# Patient Record
Sex: Female | Born: 1950 | Race: White | Hispanic: No | Smoking: Never smoker
Health system: Southern US, Community
[De-identification: ages and names within clinical notes are randomized; demographics above are authoritative.]

## PROBLEM LIST (undated history)

## (undated) DIAGNOSIS — I10 Essential (primary) hypertension: Secondary | ICD-10-CM

## (undated) HISTORY — PX: CHOLECYSTECTOMY: SHX55

## (undated) HISTORY — PX: TONSILLECTOMY: SUR1361

---

## 2001-11-17 ENCOUNTER — Observation Stay (HOSPITAL_COMMUNITY): Admission: EM | Admit: 2001-11-17 | Discharge: 2001-11-18 | Payer: Self-pay | Admitting: Emergency Medicine

## 2001-11-18 ENCOUNTER — Encounter: Payer: Self-pay | Admitting: Emergency Medicine

## 2011-05-17 ENCOUNTER — Emergency Department (HOSPITAL_BASED_OUTPATIENT_CLINIC_OR_DEPARTMENT_OTHER)
Admission: EM | Admit: 2011-05-17 | Discharge: 2011-05-17 | Disposition: A | Discharge status: Home / Self Care | Payer: PRIVATE HEALTH INSURANCE | Attending: Emergency Medicine | Admitting: Emergency Medicine

## 2011-05-17 ENCOUNTER — Encounter: Payer: Self-pay | Admitting: *Deleted

## 2011-05-17 DIAGNOSIS — R05 Cough: Secondary | ICD-10-CM | POA: Insufficient documentation

## 2011-05-17 DIAGNOSIS — E669 Obesity, unspecified: Secondary | ICD-10-CM | POA: Insufficient documentation

## 2011-05-17 DIAGNOSIS — R059 Cough, unspecified: Secondary | ICD-10-CM | POA: Insufficient documentation

## 2011-05-17 DIAGNOSIS — J069 Acute upper respiratory infection, unspecified: Secondary | ICD-10-CM | POA: Insufficient documentation

## 2011-05-17 NOTE — ED Notes (Signed)
Patient states she has been coughing for the past two weeks and started running a fever yesterday, took tylenol yesterday , took tylenoll cold and flu several times over the past two weeks, fever 102 yesterday

## 2011-05-17 NOTE — ED Provider Notes (Addendum)
History     CSN: [id number]  Arrival date & time 05/17/11  4098   First MD Initiated Contact with Patient 05/17/11 [phone]      Chief Complaint  Patient presents with  . Cough    (Consider location/radiation/quality/duration/timing/severity/associated sxs/prior treatment) HPI Patient dry cough for two weeks.  Yesterday fever to 102.  No nasal congestion, no vomiting or diarrhea.  Patient took tylenol twice yesterday last antipyretic about 7 pm last night.  Feels better today except diffuse soreness.  Patient did not have flu shot- no sick contacts.  History reviewed. No pertinent past medical history.  Past Surgical History  Procedure Date  . Cholecystectomy   . Tonsillectomy     No family history on file.  History  Substance Use Topics  . Smoking status: Never Smoker   . Smokeless tobacco: Not on file  . Alcohol Use: No    OB History    Grav Para Term Preterm Abortions TAB SAB Ect Mult Living                  Review of Systems  All other systems reviewed and are negative.    Allergies  Demerol  Home Medications   Current Outpatient Rx  Name Route Sig Dispense Refill  . DULOXETINE HCL 60 MG PO CPEP Oral Take 60 mg by mouth daily.        BP 147/70  Pulse 80  Temp(Src) 99.1 F (37.3 C) (Oral)  Resp 18  SpO2 99%  Physical Exam  Nursing note and vitals reviewed. Constitutional: She is oriented to person, place, and time. She appears well-developed and well-nourished.       obese  HENT:  Head: Normocephalic and atraumatic.  Right Ear: External ear normal.  Left Ear: External ear normal.  Nose: Nose normal.  Mouth/Throat: Oropharynx is clear and moist.  Eyes: Conjunctivae and EOM are normal. Pupils are equal, round, and reactive to light.  Neck: Normal range of motion. Neck supple.  Cardiovascular: Normal rate, regular rhythm and normal heart sounds.   Pulmonary/Chest: Effort normal and breath sounds normal.  Abdominal: Soft. Bowel sounds are  normal.  Musculoskeletal: Normal range of motion.  Neurological: She is alert and oriented to person, place, and time. She has normal reflexes.  Skin: Skin is warm and dry.  Psychiatric: She has a normal mood and affect.    ED Course  Procedures (including critical care time)  Labs Reviewed - No data to display No results found.   No diagnosis found.            Hilario Quarry, MD 05/17/11 4782  Hilario Quarry, MD 05/17/11 [phone]

## 2013-11-01 DIAGNOSIS — I890 Lymphedema, not elsewhere classified: Secondary | ICD-10-CM | POA: Insufficient documentation

## 2013-11-01 DIAGNOSIS — R252 Cramp and spasm: Secondary | ICD-10-CM | POA: Insufficient documentation

## 2013-12-13 DIAGNOSIS — F32A Depression, unspecified: Secondary | ICD-10-CM | POA: Insufficient documentation

## 2013-12-13 DIAGNOSIS — F329 Major depressive disorder, single episode, unspecified: Secondary | ICD-10-CM | POA: Insufficient documentation

## 2013-12-13 DIAGNOSIS — M25569 Pain in unspecified knee: Secondary | ICD-10-CM | POA: Insufficient documentation

## 2013-12-13 DIAGNOSIS — R600 Localized edema: Secondary | ICD-10-CM | POA: Insufficient documentation

## 2015-07-17 ENCOUNTER — Ambulatory Visit: Payer: PRIVATE HEALTH INSURANCE | Admitting: Family Medicine

## 2015-07-29 ENCOUNTER — Ambulatory Visit: Payer: PRIVATE HEALTH INSURANCE | Admitting: Family Medicine

## 2015-11-04 ENCOUNTER — Ambulatory Visit (INDEPENDENT_AMBULATORY_CARE_PROVIDER_SITE_OTHER): Payer: No Typology Code available for payment source | Admitting: Family Medicine

## 2015-11-04 ENCOUNTER — Ambulatory Visit (INDEPENDENT_AMBULATORY_CARE_PROVIDER_SITE_OTHER)
Admission: RE | Admit: 2015-11-04 | Discharge: 2015-11-04 | Disposition: A | Discharge status: Home / Self Care | Payer: No Typology Code available for payment source | Source: Ambulatory Visit | Attending: Family Medicine | Admitting: Family Medicine

## 2015-11-04 ENCOUNTER — Encounter: Payer: Self-pay | Admitting: Family Medicine

## 2015-11-04 VITALS — BP 118/78 | HR 69 | Wt 266.0 lb

## 2015-11-04 DIAGNOSIS — M25561 Pain in right knee: Secondary | ICD-10-CM

## 2015-11-04 DIAGNOSIS — M25562 Pain in left knee: Secondary | ICD-10-CM | POA: Diagnosis not present

## 2015-11-04 DIAGNOSIS — M170 Bilateral primary osteoarthritis of knee: Secondary | ICD-10-CM | POA: Diagnosis not present

## 2015-11-04 MED ORDER — DICLOFENAC SODIUM 2 % TD SOLN: 2.0000 "application " | Freq: Two times a day (BID) | TRANSDERMAL | Status: AC

## 2015-11-04 NOTE — Progress Notes (Signed)
Pre visit review using our clinic review tool, if applicable. No additional management support is needed unless otherwise documented below in the visit note. 

## 2015-11-04 NOTE — Assessment & Plan Note (Signed)
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 X-rays are pending. Given home exercises and work with Event organiserathletic trainer. Prescription for topical anti-inflammatories given. Attempted to injections of the knees bilaterally. I'm hoping that this will be beneficial for quite some time. We discussed proper shoes, icing protocol, we discussed which activities to avoid. Patient will try some over-the-counter natural supple mentation's. Follow-up again in 4 weeks. She could be a candidate for viscous supplementation.

## 2015-11-04 NOTE — Progress Notes (Signed)
[name] D.O. [facility] [address] [place], [place] [address] Phone: [phone] Subjective:    I'm seeing this patient by the request  of:  VELAZQUEZ,GRETCHEN, MD   CC: Bilateral knee pain  VOZ:[id number]:Subjective [name] is a 65 y.o. female coming in with complaint of bilateral knee pain. Patient has had this pain for multiple years. Had seen another provider [facility] 5 years ago and did need this is supplementation. Has been told that she has had arthritis. Patient states that the knees are becoming worse and worse affecting daily activities. Things like walking long distances or going up and downstairs has been very difficult. Sometimes seems to be associated with swelling. Sometimes some mild instability. Never has fallen recently. Feels like the initial fall 18 years ago could've exacerbated the situation now. Patient denies any radiation down the legs. Denies any numbness. Wondering what all she can potentially do to help her knees. Once to avoid any surgical intervention if possible. Rates the severity of pain is 7 out of 10. Seems to most of the time the right knee as well as poor recently the left knee has been more severe. Tried some over-the-counter medications with minimal improvement.    No past medical history on file. Past Surgical History  Procedure Laterality Date  . Cholecystectomy    . Tonsillectomy     Social History   Social History  . Marital Status: Divorced    Spouse Name: N/A  . Number of Children: N/A  . Years of Education: N/A   Social History Main Topics  . Smoking status: Never Smoker   . Smokeless tobacco: Not on file  . Alcohol Use: No  . Drug Use: No  . Sexual Activity: Not on file   Other Topics Concern  . Not on file   Social History Narrative  . No narrative on file   Allergies  Allergen Reactions  . Demerol Itching   No family history on file. No family history of rheumatological diseases  Past medical history,  social, surgical and family history all reviewed in electronic medical record.  No pertanent information unless stated regarding to the chief complaint.   Review of Systems: No headache, visual changes, nausea, vomiting, diarrhea, constipation, dizziness, abdominal pain, skin rash, fevers, chills, night sweats, weight loss, swollen lymph nodes, body aches, joint swelling, muscle aches, chest pain, shortness of breath, mood changes.   Objective Blood pressure 118/78, pulse 69, weight 266 lb (120.657 kg), SpO2 98 %.  General: No apparent distress alert and oriented x3 mood and affect normal, dressed appropriately. Obese HEENT: Pupils equal, extraocular movements intact  Respiratory: Patient's speak in full sentences and does not appear short of breath  Cardiovascular: No lower extremity edema, non tender, no erythema  Skin: Warm dry intact with no signs of infection or rash on extremities or on axial skeleton.  Abdomen: Soft nontender  Neuro: Cranial nerves II through XII are intact, neurovascularly intact in all extremities with 2+ DTRs and 2+ pulses.  Lymph: No lymphadenopathy of posterior or anterior cervical chain or axillae bilaterally.  Gait normal with good balance and coordination.  MSK:  Non tender with full range of motion and good stability and symmetric strength and tone of shoulders, elbows, wrist, hip, and ankles bilaterally.  Knee: Bilateral Difficult to assess secondary to patient's body habitus. Patient does have some mild varus deformity of the knees bilaterally. Patient does have significant thigh to calf ratio. Severely tender to palpation over the medial  joint line bilaterally left greater than right. ROM full in flexion and extension and lower leg rotation. Stability noted of the knees bilaterally with valgus force Negative Mcmurray's, Apley's, and Thessalonian tests. Patient's patellas are chronically subluxed superior laterally. Patellar glide with moderate  crepitus. Patellar and quadriceps tendons unremarkable. Hamstring and quadriceps strength is normal.   After informed written and verbal consent, patient was seated on exam table. Right knee was prepped with alcohol swab and utilizing anterolateral approach, patient's right knee space was injected with 4:1  marcaine 0.5%: Kenalog /dL. Patient tolerated the procedure well without immediate complications.  After informed written and verbal consent, patient was seated on exam table. Left knee was prepped with alcohol swab and utilizing anterolateral approach, patient's left knee space was injected with 4:1  marcaine 0.5%: Kenalog /dL. Patient tolerated the procedure well without immediate complications.   Procedure note 97110; 15 minutes spent for Therapeutic exercises as stated in above notes.  This included exercises focusing on stretching, strengthening, with significant focus on eccentric aspects.  Flexion and extension exercises working on the vastus medialis oblique strengthening. Proper technique shown and discussed handout in great detail with ATC.  All questions were discussed and answered.     Impression and Recommendations:     This case required medical decision making of moderate complexity.      Note: This dictation was prepared with Dragon dictation along with smaller phrase technology. Any transcriptional errors that result from this process are unintentional.

## 2015-11-04 NOTE — Assessment & Plan Note (Signed)
Encourage weight loss. 

## 2015-11-04 NOTE — Patient Instructions (Signed)
Good to see you  Xrays downstairs today  Ice 20 minutes 2 times daily. Usually after activity and before bed. Exercises 3 times a week.  pennsaid pinkie amount topically 2 times daily as needed.  Vitamin D 2000 IU daily  Turmeric 500mg  twice daily  Fish oil 32 grams daily  Tart cherry extract any dose at night can all help arthritis.  Good shoes with rigid bottom.  Dierdre HarnessKeen, Dansko, Merrell or New balance greater then 700 I like the bike and the water areobics for exercise See me again in 4 weeks and if not a lot better we will discuss bracing and the other injections.

## 2015-12-02 ENCOUNTER — Encounter: Payer: Self-pay | Admitting: Family Medicine

## 2015-12-02 ENCOUNTER — Ambulatory Visit (INDEPENDENT_AMBULATORY_CARE_PROVIDER_SITE_OTHER): Payer: No Typology Code available for payment source | Admitting: Family Medicine

## 2015-12-02 VITALS — BP 118/78 | HR 76 | Wt 265.0 lb

## 2015-12-02 DIAGNOSIS — R252 Cramp and spasm: Secondary | ICD-10-CM

## 2015-12-02 DIAGNOSIS — M170 Bilateral primary osteoarthritis of knee: Secondary | ICD-10-CM

## 2015-12-02 NOTE — Assessment & Plan Note (Signed)
Discussed icing supplementation. Patient looking at her labs also has had high TSH. Questionable hypothyroidism. Patient will address with probing care provider.

## 2015-12-02 NOTE — Patient Instructions (Signed)
Good to see you  I am glad the knees are feeling better.  Keep up with everything you are doing.  Would consider iron 65mg  daily with 500mg  of vitamin C If constipation then 3 times a week.  For the diet consider decrease coconut oil and more olive oil.  Fresh foods the better Send me a message in 2 weeks See me again in 2 months

## 2015-12-02 NOTE — Progress Notes (Signed)
[name] D.O. [facility] [address] [place], [place] [address] Phone: [phone] Subjective:    I'm seeing this patient by the request  of:  VELAZQUEZ,GRETCHEN, MD   CC: Bilateral knee pain follow-up  MVH:[id number]:Subjective Jenny GinsSherry [name] is a 65 y.o. female coming in with complaint of bilateral knee pain. Patient sent have arthritic changes of the knee. Elected to have steroid injection. Doing better at this time. States that pain is significantly less. Using the topical anti-inflammatories occasionally. Doing the over-the-counter medications. Feels like she is having more endurance in her legs as well. He is having cramping at night.    No past medical history on file. Past Surgical History  Procedure Laterality Date  . Cholecystectomy    . Tonsillectomy     Social History   Social History  . Marital Status: Divorced    Spouse Name: N/A  . Number of Children: N/A  . Years of Education: N/A   Social History Main Topics  . Smoking status: Never Smoker   . Smokeless tobacco: None  . Alcohol Use: No  . Drug Use: No  . Sexual Activity: Not Asked   Other Topics Concern  . None   Social History Narrative   Allergies  Allergen Reactions  . Demerol Itching   No family history on file. No family history of rheumatological diseases  Past medical history, social, surgical and family history all reviewed in electronic medical record.  No pertanent information unless stated regarding to the chief complaint.   Review of Systems: No headache, visual changes, nausea, vomiting, diarrhea, constipation, dizziness, abdominal pain, skin rash, fevers, chills, night sweats, weight loss, swollen lymph nodes, body aches, joint swelling, muscle aches, chest pain, shortness of breath, mood changes.   Objective Blood pressure 118/78, pulse 76, weight 265 lb (120.203 kg).  General: No apparent distress alert and oriented x3 mood and affect normal, dressed appropriately.  Obese HEENT: Pupils equal, extraocular movements intact  Respiratory: Patient's speak in full sentences and does not appear short of breath  Cardiovascular: No lower extremity edema, non tender, no erythema  Skin: Warm dry intact with no signs of infection or rash on extremities or on axial skeleton.  Abdomen: Soft nontender  Neuro: Cranial nerves II through XII are intact, neurovascularly intact in all extremities with 2+ DTRs and 2+ pulses.  Lymph: No lymphadenopathy of posterior or anterior cervical chain or axillae bilaterally.  Gait normal with good balance and coordination.  MSK:  Non tender with full range of motion and good stability and symmetric strength and tone of shoulders, elbows, wrist, hip, and ankles bilaterally.  Knee: Bilateral Difficult to assess secondary to patient's body habitus. Patient does have some mild varus deformity of the knees bilaterally. Patient does have significant thigh to calf ratio. Severely tender to palpation over the medial joint line bilaterally left greater than right. ROM full in flexion and extension and lower leg rotation. Stability noted of the knees bilaterally with valgus force Negative Mcmurray's, Apley's, and Thessalonian tests. Patient's patellas are chronically subluxed superior laterally. Patellar glide with moderate crepitus. Patellar and quadriceps tendons unremarkable. Hamstring and quadriceps strength is normal.       Impression and Recommendations:     This case required medical decision making of moderate complexity.      Note: This dictation was prepared with Dragon dictation along with smaller phrase technology. Any transcriptional errors that result from this process are unintentional.

## 2015-12-02 NOTE — Assessment & Plan Note (Signed)
Better after injection. We discussed we can repeat this every 3 months. If it does not last long she is a candidate for viscous supplementation. We discussed icing regimen, proper shoes, and continuing the topical anti-implant towards. Follow-up in 2 months.

## 2016-02-05 ENCOUNTER — Ambulatory Visit: Payer: No Typology Code available for payment source | Admitting: Family Medicine

## 2016-03-02 DIAGNOSIS — I10 Essential (primary) hypertension: Secondary | ICD-10-CM | POA: Insufficient documentation

## 2016-03-02 DIAGNOSIS — F419 Anxiety disorder, unspecified: Secondary | ICD-10-CM | POA: Insufficient documentation

## 2016-11-03 DIAGNOSIS — E039 Hypothyroidism, unspecified: Secondary | ICD-10-CM | POA: Insufficient documentation

## 2016-11-03 DIAGNOSIS — R7301 Impaired fasting glucose: Secondary | ICD-10-CM | POA: Insufficient documentation

## 2016-11-16 ENCOUNTER — Encounter: Payer: Self-pay | Admitting: Family Medicine

## 2016-11-16 ENCOUNTER — Ambulatory Visit (INDEPENDENT_AMBULATORY_CARE_PROVIDER_SITE_OTHER): Payer: BLUE CROSS/BLUE SHIELD | Admitting: Family Medicine

## 2016-11-16 DIAGNOSIS — S83411A Sprain of medial collateral ligament of right knee, initial encounter: Secondary | ICD-10-CM | POA: Diagnosis not present

## 2016-11-16 DIAGNOSIS — M170 Bilateral primary osteoarthritis of knee: Secondary | ICD-10-CM

## 2016-11-16 NOTE — Patient Instructions (Signed)
Good to see you  Jenny [name] is your friend.  Stay active but no twisting.  If you change your mind on the brace call me.  See me again in 3 weeks and if not better we will do injection.

## 2016-11-16 NOTE — Assessment & Plan Note (Signed)
Patient does have moderate to severe arthritis. Year. Wants to hold on any type of injection. MCL sprain. Exercises given. Follow-up again in 4 weeks

## 2016-11-16 NOTE — Progress Notes (Signed)
Jenny ScaleZach Daniyla [name] D.O. [facility] [address] [place], [place] [address] Phone: [phone] Subjective:    I'm seeing this patient by the request  of:    CC: Bilateral knee pain  XBM:[id number]:Subjective  Jenny GinsSherry [name] is a 66 y.o. female coming in with complaint of Knee pain, bilateral, severe again. Has been doing well. Patient states 4 weeks ago started having increasing pain. Does not remember any specific injury. States that the pain seems to be more specific. States that it seems to be on the medial aspect of the knee. More of a burning sensation even with resting which is different. Maybe some increasing instability. Denies any radiation of pain, denies any numbness.      No past medical history on file. Past Surgical History:  Procedure Laterality Date  . CHOLECYSTECTOMY    . TONSILLECTOMY     Social History   Social History  . Marital status: Divorced    Spouse name: N/A  . Number of children: N/A  . Years of education: N/A   Social History Main Topics  . Smoking status: Never Smoker  . Smokeless tobacco: Never Used  . Alcohol use No  . Drug use: No  . Sexual activity: Not Asked   Other Topics Concern  . None   Social History Narrative  . None   Allergies  Allergen Reactions  . Demerol Itching  . Meperidine Itching   No family history on file.  Past medical history, social, surgical and family history all reviewed in electronic medical record.  No pertanent information unless stated regarding to the chief complaint.   Review of Systems:Review of systems updated and as accurate as of 11/16/16  No headache, visual changes, nausea, vomiting, diarrhea, constipation, dizziness, abdominal pain, skin rash, fevers, chills, night sweats, weight loss, swollen lymph nodes, body aches, joint swelling, muscle aches, chest pain, shortness of breath, mood changes.   Objective  Blood pressure 114/72, pulse 80, height 5\' 2"  (1.575 m), weight 285 lb (129.3 kg), SpO2 97  %. Systems examined below as of 11/16/16   General: No apparent distress alert and oriented x3 mood and affect normal, dressed appropriately. Morbidly obese HEENT: Pupils equal, extraocular movements intact  Respiratory: Patient's speak in full sentences and does not appear short of breath  Cardiovascular: No lower extremity edema, non tender, no erythema  Skin: Warm dry intact with no signs of infection or rash on extremities or on axial skeleton.  Abdomen: Soft nontender  Neuro: Cranial nerves II through XII are intact, neurovascularly intact in all extremities with 2+ DTRs and 2+ pulses.  Lymph: No lymphadenopathy of posterior or anterior cervical chain or axillae bilaterally.  Gait normal with good balance and coordination.  MSK:  Non tender with full range of motion and good stability and symmetric strength and tone of shoulders, elbows, wrist, hip, and ankles bilaterally.  Knee: Bilateral valgus deformity noted. Large thigh to calf ratio.  Tender to palpation over medial and PF joint line.  ROM full in flexion and extension and lower leg rotation. instability with valgus force. Especially on the right knee painful patellar compression. Patellar glide with moderate crepitus. Patellar and quadriceps tendons unremarkable. Hamstring and quadriceps strength is normal.  Procedure: Limited ultrasound of right knee Device: GE logiq Q7 Findings: Patient's right knee shows the patient is in severe nearly bone-on-bone osteophytic changes of the medial compartment. Patient also has moderate narrowing of the patellofemoral joint. Patient does have what appears to be a chronic degenerative  tear but a new MCL sprain versus partial tear. Some calcific changes noted with hypoechoic changes and increasing Doppler flow Impression: Arthritis with MCL sprain Images permanently stored in the unit and are available for review.  Procedure note 97110; 15 minutes spent for Therapeutic exercises as stated  in above notes.  This included exercises focusing on stretching, strengthening, with significant focus on eccentric aspects. Flexion and extension exercises, discussed eccentric severe hamstring. VMO strengthening.   Proper technique shown and discussed handout in great detail with ATC.  All questions were discussed and answered.     Impression and Recommendations:     This case required medical decision making of moderate complexity.      Note: This dictation was prepared with Dragon dictation along with smaller phrase technology. Any transcriptional errors that result from this process are unintentional.

## 2016-11-16 NOTE — Assessment & Plan Note (Addendum)
Partial tear noted. We discussed icing regimen and home exercises. We discussed objective recently do a which ones to avoid. Encourage patient to increase activity as tolerated. Follow-up again in 4-6 weeks.

## 2016-12-03 ENCOUNTER — Ambulatory Visit: Payer: No Typology Code available for payment source | Admitting: Family Medicine

## 2016-12-07 ENCOUNTER — Encounter: Payer: Self-pay | Admitting: Family Medicine

## 2016-12-07 ENCOUNTER — Ambulatory Visit (INDEPENDENT_AMBULATORY_CARE_PROVIDER_SITE_OTHER): Payer: BLUE CROSS/BLUE SHIELD | Admitting: Family Medicine

## 2016-12-07 DIAGNOSIS — M170 Bilateral primary osteoarthritis of knee: Secondary | ICD-10-CM

## 2016-12-07 NOTE — Progress Notes (Signed)
Pre visit review using our clinic review tool, if applicable. No additional management support is needed unless otherwise documented below in the visit note. 

## 2016-12-07 NOTE — Progress Notes (Signed)
Jenny [name] D.O. [facility] [address] [place], [place] [address] Phone: [phone] Subjective:    I'm seeing this patient by the request  of:    CC: bilateral knee pain    ZHY:[id number]:Subjective  Jenny GinsSherry [name] is a 66 y.o. female coming in with complaint of bilateral knee pain. Patient was found to have an MCL sprain on the right side as well as severe osteoarthritic changes bilaterally. Patient states that she's been doing the home exercises icing regimen, as well as the topical anti-inflammatories. States that stability of the knees are still considerably bad. Patient also is noticing some swelling. Given pain even with regular daily activities. Patient is concern for a long time that this is been going on at this time.     No past medical history on file. Past Surgical History:  Procedure Laterality Date  . CHOLECYSTECTOMY    . TONSILLECTOMY     Social History   Social History  . Marital status: Divorced    Spouse name: N/A  . Number of children: N/A  . Years of education: N/A   Social History Main Topics  . Smoking status: Never Smoker  . Smokeless tobacco: Never Used  . Alcohol use No  . Drug use: No  . Sexual activity: Not Asked   Other Topics Concern  . None   Social History Narrative  . None   Allergies  Allergen Reactions  . Demerol Itching  . Meperidine Itching   No family history on file. no hx of autoimmune disease.   Past medical history, social, surgical and family history all reviewed in electronic medical record.  No pertanent information unless stated regarding to the chief complaint.   Review of Systems:Review of systems updated and as accurate as of 12/07/16  No headache, visual changes, nausea, vomiting, diarrhea, constipation, dizziness, abdominal pain, skin rash, fevers, chills, night sweats, weight loss, swollen lymph nodes, body aches, joint swelling, muscle aches, chest pain, shortness of breath, mood changes.   Objective    Blood pressure 140/80, pulse 79, height 5\' 2"  (1.575 m), weight 283 lb (128.4 kg), SpO2 98 %. Systems examined below as of 12/07/16   General: No apparent distress alert and oriented x3 mood and affect normal, dressed appropriately. Morbidly obese.  HEENT: Pupils equal, extraocular movements intact  Respiratory: Patient's speak in full sentences and does not appear short of breath  Cardiovascular: No lower extremity edema, non tender, no erythema  Skin: Warm dry intact with no signs of infection or rash on extremities or on axial skeleton.  Abdomen: Soft nontender  Neuro: Cranial nerves II through XII are intact, neurovascularly intact in all extremities with 2+ DTRs and 2+ pulses.  Lymph: No lymphadenopathy of posterior or anterior cervical chain or axillae bilaterally.  Gait antalgic gait .  MSK:  Non tender with full range of motion and good stability and symmetric strength and tone of shoulders, elbows, wrist, hip, and ankles bilaterally.  Knee:ilateral valgus deformity noted. Large thigh to calf ratio.  Tender to palpation over medial and PF joint line.  ROM full in flexion and extension and lower leg rotation. instability with valgus force.  painful patellar compression. Patellar glide with moderate crepitus. Patellar and quadriceps tendons unremarkable. Hamstring and quadriceps strength is normal.  After informed written and verbal consent, patient was seated on exam table. Right knee was prepped with alcohol swab and utilizing anterolateral approach, patient's right knee space was injected with 4:1  marcaine 0.5%: Kenalog 40mg /dL. Patient  tolerated the procedure well without immediate complications.  After informed written and verbal consent, patient was seated on exam table. Left knee was prepped with alcohol swab and utilizing anterolateral approach, patient's left knee space was injected with 4:1  marcaine 0.5%: Kenalog 40mg /dL. Patient tolerated the procedure well without  immediate complications.   Impression and Recommendations:     This case required medical decision making of moderate complexity.      Note: This dictation was prepared with Dragon dictation along with smaller phrase technology. Any transcriptional errors that result from this process are unintentional.

## 2016-12-07 NOTE — Patient Instructions (Signed)
Good to see you  Ice is your friend Stay active See me again in 4 weeks and we can consider synvisc if needed We also will discuss bracing maybe.

## 2016-12-07 NOTE — Assessment & Plan Note (Signed)
epeat injection discussed with patient at great length. We discussed icing regimen and home exercises. Patient could be a candidate for viscous supplementation if needed. We discussed icing regimen. Patient continue conservative therapy. Follow-up again in 4 weeks

## 2017-01-04 ENCOUNTER — Ambulatory Visit: Payer: BLUE CROSS/BLUE SHIELD | Admitting: Family Medicine

## 2017-01-05 ENCOUNTER — Ambulatory Visit (INDEPENDENT_AMBULATORY_CARE_PROVIDER_SITE_OTHER): Payer: BLUE CROSS/BLUE SHIELD | Admitting: Family Medicine

## 2017-01-05 ENCOUNTER — Encounter: Payer: Self-pay | Admitting: Family Medicine

## 2017-01-05 DIAGNOSIS — M170 Bilateral primary osteoarthritis of knee: Secondary | ICD-10-CM | POA: Diagnosis not present

## 2017-01-05 NOTE — Patient Instructions (Signed)
Good to see you  Ice is your friend  Keep it up and do everything you are doing.  Wear thum brace at night See me again in 8 weeks

## 2017-01-05 NOTE — Progress Notes (Signed)
  Tawana ScaleZach Rahaf Carbonell D.O. [facility] [address] [place], [place] [address] Phone: [phone] Subjective:    I'm seeing this patient by the request  of:    CC: bilateral knee pain f/u   BJY:NWGNFAOZHYHPI:Subjective  [name] is a 66 y.o. female coming in with complaint of bilateral knee pain. Patient was found to have an MCL sprain on the right side as well as severe osteoarthritic changes bilaterally. Patient was given injections and feels 80% better. Patient is having very mild pain on the right side. Patient is been doing the home exercises fairly regularly. Patient has noticed that as long as she stays active it seems to feel better. No swelling. Minimal walking     No past medical history on file. Past Surgical History:  Procedure Laterality Date  . CHOLECYSTECTOMY    . TONSILLECTOMY     Social History   Social History  . Marital status: Divorced    Spouse name: N/A  . Number of children: N/A  . Years of education: N/A   Social History Main Topics  . Smoking status: Never Smoker  . Smokeless tobacco: Never Used  . Alcohol use No  . Drug use: No  . Sexual activity: Not Asked   Other Topics Concern  . None   Social History Narrative  . None   Allergies  Allergen Reactions  . Demerol Itching  . Meperidine Itching   No family history on file. no hx of autoimmune disease.   Past medical history, social, surgical and family history all reviewed in electronic medical record.  No pertanent information unless stated regarding to the chief complaint.   Review of Systems: No headache, visual changes, nausea, vomiting, diarrhea, constipation, dizziness, abdominal pain, skin rash, fevers, chills, night sweats, weight loss, swollen lymph nodes, body aches, joint swelling, chest pain, shortness of breath, mood changes.  Positive muscle aches  Objective  Blood pressure 132/88, pulse 60, height 5' 2.5" (1.588 m), weight 280 lb (127 kg).   Systems examined below as of  01/05/17 General: NAD A&O x3 mood, affect normal  HEENT: Pupils equal, extraocular movements intact no nystagmus Respiratory: not short of breath at rest or with speaking Cardiovascular: No lower extremity edema, non tender Skin: Warm dry intact with no signs of infection or rash on extremities or on axial skeleton. Abdomen: Soft nontender, no masses Neuro: Cranial nerves  intact, neurovascularly intact in all extremities with 2+ DTRs and 2+ pulses. Lymph: No lymphadenopathy appreciated today  Gait Mild antalgic gait MSK: Non tender with full range of motion and good stability and symmetric strength and tone of shoulders, elbows, wrist,  hips and ankles bilaterally.   Knee: Bilateral valgus deformity noted. Large thigh to calf ratio.  Mild pain over the medial joint line. Only on the right side ROM full in flexion and extension and lower leg rotation. instability with valgus force.  Mild painful patellar compression. Patellar glide with mild crepitus. Patellar and quadriceps tendons unremarkable. Hamstring and quadriceps strength is normal.      Impression and Recommendations:     This case required medical decision making of moderate complexity.      Note: This dictation was prepared with Dragon dictation along with smaller phrase technology. Any transcriptional errors that result from this process are unintentional.

## 2017-01-05 NOTE — Assessment & Plan Note (Signed)
Overall doing relatively well after injection. Discussed icing regimen, home exercise, icing regimen. We discussed topical anti-inflammatories again. Patient will follow-up with me again in 2 months. Can repeat injection if necessary. Encourage weight loss.

## 2017-03-01 ENCOUNTER — Ambulatory Visit (INDEPENDENT_AMBULATORY_CARE_PROVIDER_SITE_OTHER): Payer: BLUE CROSS/BLUE SHIELD | Admitting: Family Medicine

## 2017-03-01 ENCOUNTER — Encounter: Payer: Self-pay | Admitting: Family Medicine

## 2017-03-01 DIAGNOSIS — M170 Bilateral primary osteoarthritis of knee: Secondary | ICD-10-CM | POA: Diagnosis not present

## 2017-03-01 IMAGING — DX DG KNEE STANDING AP BILAT
1 series · 1 of 1 positions shown · non-contrast
Comparison: No recent prior.

CLINICAL DATA: Bilateral knee pain.  Swelling.

EXAM:
BILATERAL KNEES STANDING - 1 VIEW

[knee ap]
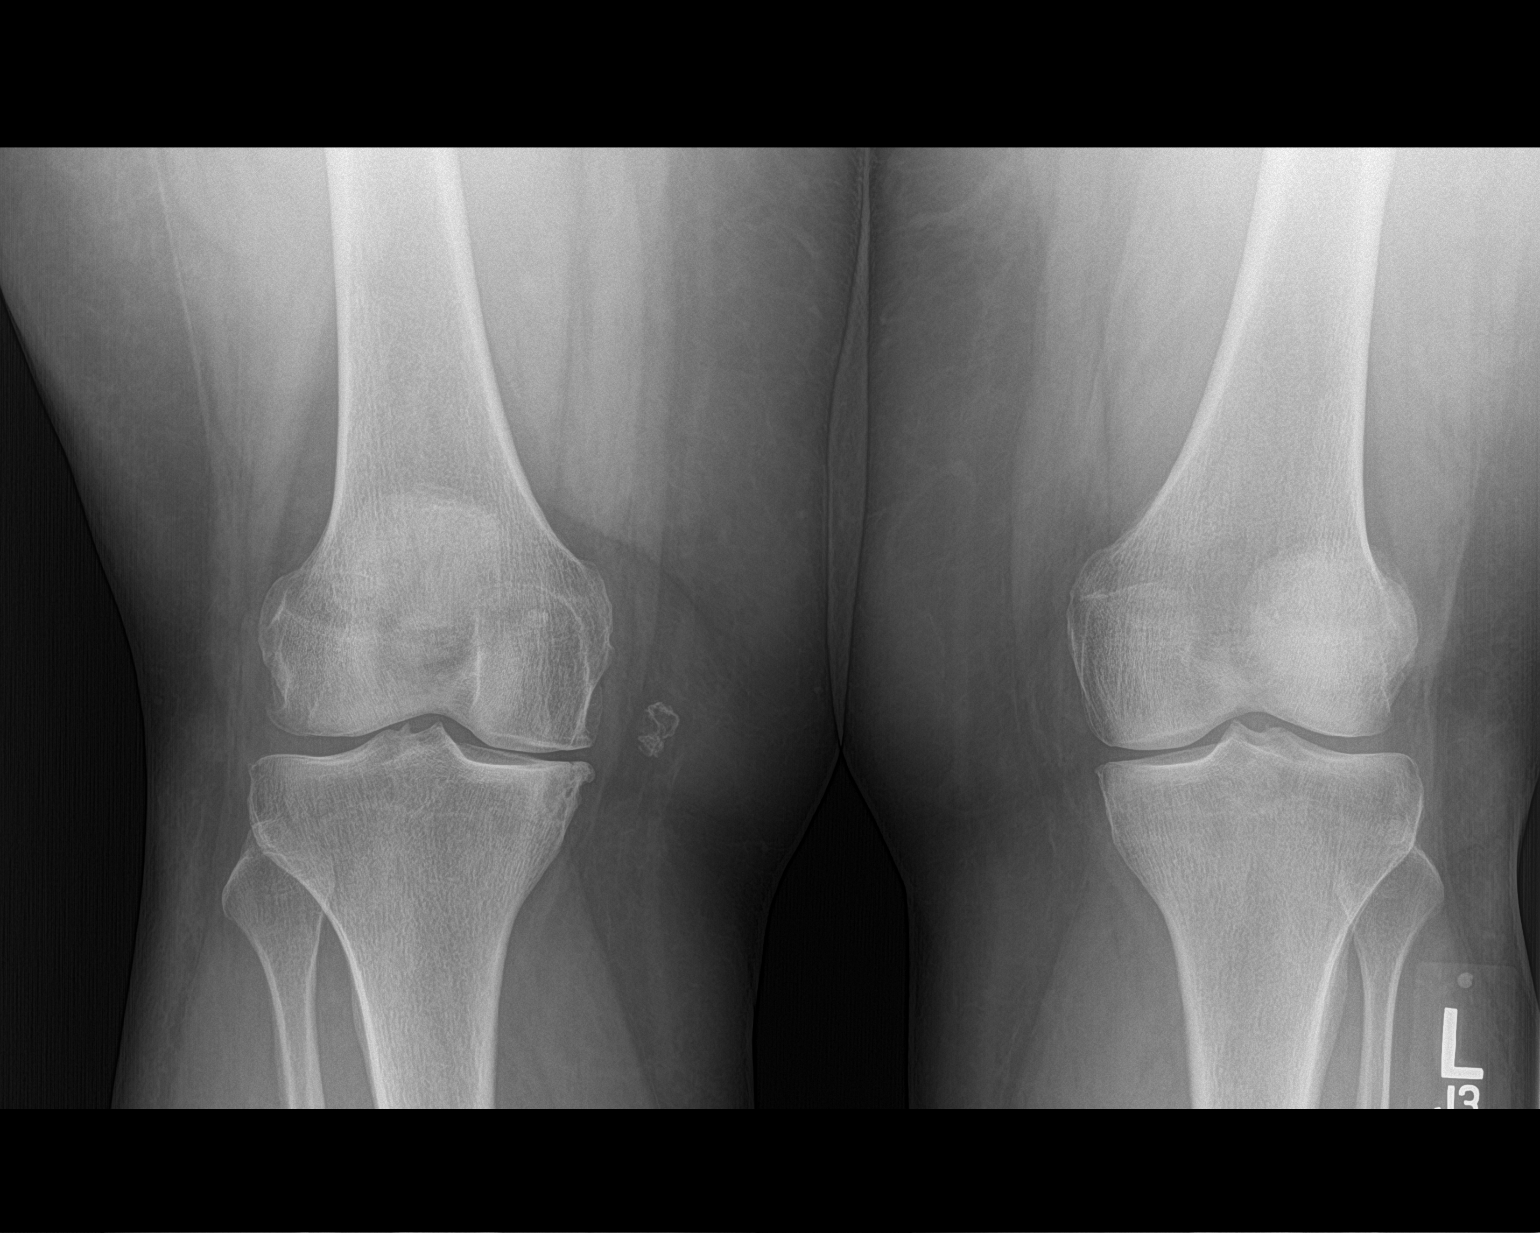

[1 of 1 positions shown; findings below may reference images not displayed]

FINDINGS: Degenerative changes noted about both knees. Degenerative changes
most prominent about the medial compartment of the right knee.
Lucency noted about a right medial tibial plateau osteophyte
consistent with tiny fracture. Calcification noted about the medial
right knee soft tissues most likely dystrophic.
IMPRESSION: Bilateral degenerative change. Degenerative changes most prominent
about the right medial compartment. Prominent medial tibial plateau
osteophyte noted with what appears to be a of fracture coursing
through the osteophyte.

## 2017-03-01 NOTE — Assessment & Plan Note (Signed)
Worsening symptoms again of the right knee. Increasing instability. Patient will be fitted for custom brace secondary to patient's amount of arthritis as well as significant instability. Patient also has an abnormal thigh to calf ratio. Patient's could be a candidate for viscous supplementation. Continue conservative therapy otherwise. Follow-up again in 4 weeks

## 2017-03-01 NOTE — Patient Instructions (Signed)
Good to see you  Jenny [name] is your friend.  pennsaid pinkie amount topically 2 times daily as needed.  Conitnue the vitamins See me again in 4 weeks and we will discuss synvisc

## 2017-03-01 NOTE — Progress Notes (Signed)
[name] [address] [place], [place] [address] Phone: [phone] Subjective:     CC: Knee pain follow-up  MWN:[id number]  Jenny [name] is a 66 y.o. female coming in with complaint of knee pain. Patient was found have degenerative arthritis of both knees. Was doing much better at last follow-up 2 months ago. Now having worsening symptoms again. Seems to be only the right needle. Increasing instability. Has fallen once secondary to the pain. Increasing swelling as well. Affecting daily activities. Rates the severity of pain a 7 out of 10     No past medical history on file. Past Surgical History:  Procedure Laterality Date  . CHOLECYSTECTOMY    . TONSILLECTOMY     Social History   Social History  . Marital status: Divorced    Spouse name: N/A  . Number of children: N/A  . Years of education: N/A   Social History Main Topics  . Smoking status: Never Smoker  . Smokeless tobacco: Never Used  . Alcohol use No  . Drug use: No  . Sexual activity: Not Asked   Other Topics Concern  . None   Social History Narrative  . None   Allergies  Allergen Reactions  . Demerol Itching  . Meperidine Itching   No family history on file. No family history rheumatological diseases   Past medical history, social, surgical and family history all reviewed in electronic medical record.  No pertanent information unless stated regarding to the chief complaint.   Review of Systems:Review of systems updated and as accurate as of 03/01/17  No headache, visual changes, nausea, vomiting, diarrhea, constipation, dizziness, abdominal pain, skin rash, fevers, chills, night sweats, weight loss, swollen lymph nodes, body aches, joint swelling, chest pain, shortness of breath, mood changes. Positive muscle aches  Objective  Blood pressure (!) 150/70, pulse 77, height  (1.575 m), weight 284 lb (128.8 kg), SpO2 98 %. Systems examined below as of 03/01/17     General: No apparent distress alert and oriented x3 mood and affect normal, dressed appropriately. obese HEENT: Pupils equal, extraocular movements intact  Respiratory: Patient's speak in full sentences and does not appear short of breath  Cardiovascular: No lower extremity edema, non tender, no erythema  Skin: Warm dry intact with no signs of infection or rash on extremities or on axial skeleton.  Abdomen: Soft nontender  Neuro: Cranial nerves II through XII are intact, neurovascularly intact in all extremities with 2+ DTRs and 2+ pulses.  Lymph: No lymphadenopathy of posterior or anterior cervical chain or axillae bilaterally.  Gait antalgic gait MSK:  Non tender with full range of motion and good stability and symmetric strength and tone of shoulders, elbows, wrist, hip, and ankles bilaterally.   Knee: Right valgus deformity noted. Large thigh to calf ratio.  Tender to palpation over medial and PF joint line.  ROM full in flexion and extension and lower leg rotation. instability with valgus force.  painful patellar compression. Patellar glide with moderate crepitus. Patellar and quadriceps tendons unremarkable. Hamstring and quadriceps strength is normal. Contralateral knee shows arthritic changes with mild instability but no pain  After informed written and verbal consent, patient was seated on exam table. Right knee was prepped with alcohol swab and utilizing anterolateral approach, patient's right knee space was injected with 4:1  marcaine 0.5%: Kenalog /dL. Patient tolerated the procedure well without immediate complications.   Impression and Recommendations:     This case required medical decision making  of moderate complexity.      Note: This dictation was prepared with Dragon dictation along with smaller phrase technology. Any transcriptional errors that result from this process are unintentional.

## 2017-03-30 ENCOUNTER — Ambulatory Visit: Payer: BLUE CROSS/BLUE SHIELD | Admitting: Family Medicine

## 2017-04-25 NOTE — Progress Notes (Signed)
Tawana ScaleZach Ellieana Dolecki D.O. [facility] [address] [place], [place] [address] Phone: [phone] Subjective:     CC: Knee pain follow-up  BJY:NWGNFAOZHYHPI:Subjective  Jenny [name] is a 66 y.o. female coming in with complaint of bilateral knee pain.  Patient was having worsening pain in the right knee and was given injection March 01, 2017.  Patient was to continue with conservative therapy.  Patient states that last time she had longer relief from the shot. Patient does not that she feel 3 weeks ago onto both knees. She does not have any sharp pains but does note that she had a "knot" on the left knee. The right knee has been bothering her when she walks.  The severity of pain is 5 out of 10.        No past medical history on file. Past Surgical History:  Procedure Laterality Date  . CHOLECYSTECTOMY    . TONSILLECTOMY     Social History   Socioeconomic History  . Marital status: Divorced    Spouse name: None  . Number of children: None  . Years of education: None  . Highest education level: None  Social Needs  . Financial resource strain: None  . Food insecurity - worry: None  . Food insecurity - inability: None  . Transportation needs - medical: None  . Transportation needs - non-medical: None  Occupational History  . None  Tobacco Use  . Smoking status: Never Smoker  . Smokeless tobacco: Never Used  Substance and Sexual Activity  . Alcohol use: No  . Drug use: No  . Sexual activity: None  Other Topics Concern  . None  Social History Narrative  . None   Allergies  Allergen Reactions  . Demerol Itching  . Meperidine Itching   No family history on file.  No family history of autoimmune   Past medical history, social, surgical and family history all reviewed in electronic medical record.  No pertanent information unless stated regarding to the chief complaint.   Review of Systems:Review of systems updated and as accurate as of 04/26/17  No headache, visual  changes, nausea, vomiting, diarrhea, constipation, dizziness, abdominal pain, skin rash, fevers, chills, night sweats, weight loss, swollen lymph nodes, body aches, joint swelling,  chest pain, shortness of breath, mood changes.  Positive muscle aches  Objective  Blood pressure 128/90, pulse 60, height 5' 2.5" (1.588 m), weight 280 lb (127 kg), SpO2 97 %. Systems examined below as of 04/26/17   General: No apparent distress alert and oriented x3 mood and affect normal, dressed appropriately.  HEENT: Pupils equal, extraocular movements intact  Respiratory: Patient's speak in full sentences and does not appear short of breath  Cardiovascular: No lower extremity edema, non tender, no erythema  Skin: Warm dry intact with no signs of infection or rash on extremities or on axial skeleton.  Abdomen: Soft nontender  Neuro: Cranial nerves II through XII are intact, neurovascularly intact in all extremities with 2+ DTRs and 2+ pulses.  Lymph: No lymphadenopathy of posterior or anterior cervical chain or axillae bilaterally.  Gait mild antalgic gait MSK:  Non tender with full range of motion and good stability and symmetric strength and tone of shoulders,  wrist, hip, and ankles bilaterally.  Mild arthritic changes of multiple joints  Knee: Bilateral valgus deformity noted. Large thigh to calf ratio.  Tender to palpation over medial and PF joint line.  ROM full in flexion and extension and lower leg rotation. instability with valgus  force.  painful patellar compression. Patellar glide with moderate crepitus. Patellar and quadriceps tendons unremarkable. Hamstring and quadriceps strength is normal.   After informed written and verbal consent, patient was seated on exam table. Right knee was prepped with alcohol swab and utilizing anterolateral approach, patient's right knee space was injected with 4:1  marcaine 0.5%: Kenalog 40mg /dL. Patient tolerated the procedure well without immediate  complications.  After informed written and verbal consent, patient was seated on exam table. Left knee was prepped with alcohol swab and utilizing anterolateral approach, patient's left knee space was injected with 4:1  marcaine 0.5%: Kenalog 40mg /dL. Patient tolerated the procedure well without immediate complications. Impression and Recommendations:     This case required medical decision making of moderate complexity.      Note: This dictation was prepared with Dragon dictation along with smaller phrase technology. Any transcriptional errors that result from this process are unintentional.

## 2017-04-26 ENCOUNTER — Encounter: Payer: Self-pay | Admitting: Family Medicine

## 2017-04-26 ENCOUNTER — Ambulatory Visit: Payer: BLUE CROSS/BLUE SHIELD | Admitting: Family Medicine

## 2017-04-26 ENCOUNTER — Ambulatory Visit: Payer: Self-pay

## 2017-04-26 VITALS — BP 128/90 | HR 60 | Ht 62.5 in | Wt 280.0 lb

## 2017-04-26 DIAGNOSIS — M170 Bilateral primary osteoarthritis of knee: Secondary | ICD-10-CM

## 2017-04-26 DIAGNOSIS — M25561 Pain in right knee: Principal | ICD-10-CM

## 2017-04-26 DIAGNOSIS — G8929 Other chronic pain: Secondary | ICD-10-CM

## 2017-04-26 DIAGNOSIS — M25562 Pain in left knee: Secondary | ICD-10-CM

## 2017-04-26 NOTE — Assessment & Plan Note (Signed)
Bilateral injections.  Tolerated the procedure well.  I do think that the exacerbation was secondary to more of the fall recently and the exacerbation of the underlying.  Patient will consider Visco supplementation and will check with insurance.  We discussed icing regimen, continuing home exercise, which activities are doing which wants to avoid.  Follow-up with me again in 4-6 weeks

## 2017-04-26 NOTE — Patient Instructions (Signed)
Good to see you  Arnica lotion 2 times a day for the bruising.  Injections in both knees I think you would benefit from viscosupplementation. I would ask you to check with your insurance on coverage. Information they will need includes Diagnosis code- M17.0, M17.2 CPT codes:    Synvisc J7325   monovisc J7327   Orthovisc [id number] See which one is covered then call us at [phone] and we will schedule you    [id number] for the brace- this is ryan  Make an appointment in 4-6 weeks  Happy holidays!

## 2017-08-22 NOTE — Progress Notes (Signed)
Jenny ScaleZach Okey Zelek D.O. [facility] [address] [place], [place] [address] Phone: [phone] Subjective:     CC: Knee pain follow-up  BJY:NWGNFAOZHYHPI:Subjective  Jenny GinsSherry [name] is a 67 y.o. female coming in with complaint of bilateral knee pain.  Patient did have bilateral injections April 26, 2017.  Severe arthritis.  Patient was to continue conservative therapy.  Patient states that both of her knees have been bothering her. She is looking to get viscus supplementation. She has been having a hard time ambulating due to the pain. She does walk into the clinic today. She has been elevating her legs to keep the pain away. She also has been having radiating pain in the right tibia.  Has had this pain for quite some time and worsening at this moment.  Starting to give her instability.     No past medical history on file. Past Surgical History:  Procedure Laterality Date  . CHOLECYSTECTOMY    . TONSILLECTOMY     Social History   Socioeconomic History  . Marital status: Divorced    Spouse name: Not on file  . Number of children: Not on file  . Years of education: Not on file  . Highest education level: Not on file  Occupational History  . Not on file  Social Needs  . Financial resource strain: Not on file  . Food insecurity:    Worry: Not on file    Inability: Not on file  . Transportation needs:    Medical: Not on file    Non-medical: Not on file  Tobacco Use  . Smoking status: Never Smoker  . Smokeless tobacco: Never Used  Substance and Sexual Activity  . Alcohol use: No  . Drug use: No  . Sexual activity: Not on file  Lifestyle  . Physical activity:    Days per week: Not on file    Minutes per session: Not on file  . Stress: Not on file  Relationships  . Social connections:    Talks on phone: Not on file    Gets together: Not on file    Attends religious service: Not on file    Active member of club or organization: Not on file    Attends meetings of clubs or  organizations: Not on file    Relationship status: Not on file  Other Topics Concern  . Not on file  Social History Narrative  . Not on file   Allergies  Allergen Reactions  . Demerol Itching  . Meperidine Itching   No family history on file.  No family history of autoimmune   Past medical history, social, surgical and family history all reviewed in electronic medical record.  No pertanent information unless stated regarding to the chief complaint.   Review of Systems:Review of systems updated and as accurate as of 08/23/17  No headache, visual changes, nausea, vomiting, diarrhea, constipation, dizziness, abdominal pain, skin rash, fevers, chills, night sweats, weight loss, swollen lymph nodes, body aches, joint swelling, muscle aches, chest pain, shortness of breath, mood changes.   Objective  Blood pressure 120/88, pulse 84, height 5' 2.5" (1.588 m), weight 283 lb (128.4 kg), SpO2 98 %. Systems examined below as of 08/23/17   General: No apparent distress alert and oriented x3 mood and affect normal, dressed appropriately.  HEENT: Pupils equal, extraocular movements intact  Respiratory: Patient's speak in full sentences and does not appear short of breath  Cardiovascular: Trace lower extremity edema, non tender, no erythema  Skin:  Warm dry intact with no signs of infection or rash on extremities or on axial skeleton.  Abdomen: Soft nontender  Neuro: Cranial nerves II through XII are intact, neurovascularly intact in all extremities with 2+ DTRs and 2+ pulses.  Lymph: No lymphadenopathy of posterior or anterior cervical chain or axillae bilaterally.  Gait abnormal gait MSK:  Non tender with full range of motion and good stability and symmetric strength and tone of shoulders, elbows, wrist, hip, and ankles bilaterally.  Mild arthritic changes of multiple joints  Knee: Bilateral valgus deformity noted. Large thigh to calf ratio.  Tender to palpation over medial and PF joint line.    Near full in flexion and extension and lower leg rotation. instability with valgus force.  painful patellar compression. Patellar glide with moderate crepitus. Patellar and quadriceps tendons unremarkable. Hamstring and quadriceps strength is normal.   After informed written and verbal consent, patient was seated on exam table. Right knee was prepped with alcohol swab and utilizing anterolateral approach, patient's right knee space was injected with 4:1  marcaine 0.5%: Kenalog 40mg /dL. Patient tolerated the procedure well without immediate complications.  After informed written and verbal consent, patient was seated on exam table. Left knee was prepped with alcohol swab and utilizing anterolateral approach, patient's left knee space was injected with 4:1  marcaine 0.5%: Kenalog 40mg /dL. Patient tolerated the procedure well without immediate complications.    Impression and Recommendations:     This case required medical decision making of moderate complexity.      Note: This dictation was prepared with Dragon dictation along with smaller phrase technology. Any transcriptional errors that result from this process are unintentional.

## 2017-08-23 ENCOUNTER — Ambulatory Visit: Payer: BLUE CROSS/BLUE SHIELD | Admitting: Family Medicine

## 2017-08-23 ENCOUNTER — Encounter: Payer: Self-pay | Admitting: Family Medicine

## 2017-08-23 DIAGNOSIS — M170 Bilateral primary osteoarthritis of knee: Secondary | ICD-10-CM | POA: Diagnosis not present

## 2017-08-23 NOTE — Patient Instructions (Signed)
Good to see you  Sorry you are hurting.  Stay active.  Injected the knees with steroids.  See me again in 6 weeks and we will do the synvisc.

## 2017-08-23 NOTE — Assessment & Plan Note (Signed)
Bilateral injections given today.  Tolerated the procedure well.  Could be a candidate for Visco supplementation and will consider prior authorization for this the patient will check on.  We discussed custom bracing which patient has declined.  We discussed icing regimen and continue with the topical anti-inflammatories.  Follow-up with me again in 4 weeks

## 2017-09-23 ENCOUNTER — Telehealth: Payer: Self-pay | Admitting: Family Medicine

## 2017-09-23 NOTE — Telephone Encounter (Signed)
Unsure what this means

## 2017-09-23 NOTE — Telephone Encounter (Signed)
Copied from CRM [id number]. Topic: Quick Communication - See Telephone Encounter >> Sep 23, 2017  9:10 AM Rudi Coco, NT wrote: CRM for notification. See Telephone encounter for: 09/23/17.  Pt. Calling to ask if her appt. 10/03/17 :45am has been approved by her insurance if not she would like to cancel appt.

## 2017-09-26 NOTE — Telephone Encounter (Signed)
Spoke to pt, informed her that once she has met her deductible & out of pocket she will not have to pay out of pocket. Pt would like to hold on injections at this time.  appt cancelled.

## 2017-10-03 ENCOUNTER — Ambulatory Visit: Payer: BLUE CROSS/BLUE SHIELD | Admitting: Family Medicine

## 2017-10-06 ENCOUNTER — Ambulatory Visit: Payer: BLUE CROSS/BLUE SHIELD | Admitting: Family Medicine

## 2017-10-25 ENCOUNTER — Telehealth: Payer: Self-pay

## 2017-10-25 NOTE — Telephone Encounter (Signed)
Copied from CRM [id number]. Topic: Appointment Scheduling - Prior Auth Required for Appointment >> Oct 25, 2017  2:29 PM [name], [name] wrote: Patient was in on 4/2 for her knee. She said that Dr Katrinka BlazingSmith wanted her to come back in 10 weeks. The next available is June 17th, she is scheduled for that day at 3:15. She said that she is in a lot pain and would like to see if Dr Katrinka BlazingSmith would see her before then. Please call her at @ [phone]

## 2017-10-25 NOTE — Telephone Encounter (Signed)
Called patient to speak about an earlier appointment. Left message for patient to call back.

## 2017-10-26 NOTE — Telephone Encounter (Signed)
Patient scheduled earlier for bilateral knee injections.

## 2017-10-31 NOTE — Progress Notes (Signed)
[name] [address] [place], [place] [address] Phone: [phone] Subjective:    I'm seeing this patient by the request  of:    CC: Bilateral knee pain  BJY:[id number]  Jenny [name] is a 67 y.o. female coming in with complaint of bilateral knee pain.  Known arthritic changes.  Unable to do Visco supplementation secondary to cost at the moment.  Has responded fairly well to injections in the past.  Last steroid injection was 10 weeks ago.  Affecting daily activities, keeping her from doing certain activities including going up and down stairs.  Increasing swelling.     No past medical history on file. Past Surgical History:  Procedure Laterality Date  . CHOLECYSTECTOMY    . TONSILLECTOMY     Social History   Socioeconomic History  . Marital status: Divorced    Spouse name: Not on file  . Number of children: Not on file  . Years of education: Not on file  . Highest education level: Not on file  Occupational History  . Not on file  Social Needs  . Financial resource strain: Not on file  . Food insecurity:    Worry: Not on file    Inability: Not on file  . Transportation needs:    Medical: Not on file    Non-medical: Not on file  Tobacco Use  . Smoking status: Never Smoker  . Smokeless tobacco: Never Used  Substance and Sexual Activity  . Alcohol use: No  . Drug use: No  . Sexual activity: Not on file  Lifestyle  . Physical activity:    Days per week: Not on file    Minutes per session: Not on file  . Stress: Not on file  Relationships  . Social connections:    Talks on phone: Not on file    Gets together: Not on file    Attends religious service: Not on file    Active member of club or organization: Not on file    Attends meetings of clubs or organizations: Not on file    Relationship status: Not on file  Other Topics Concern  . Not on file  Social History Narrative  . Not on file   Allergies  Allergen Reactions  .  Demerol Itching  . Meperidine Itching   No family history on file.   Past medical history, social, surgical and family history all reviewed in electronic medical record.  No pertanent information unless stated regarding to the chief complaint.   Review of Systems:Review of systems updated and as accurate as of 11/01/17  No headache, visual changes, nausea, vomiting, diarrhea, constipation, dizziness, abdominal pain, skin rash, fevers, chills, night sweats, weight loss, swollen lymph nodes, body aches, joint swelling, muscle aches, chest pain, shortness of breath, mood changes.   Objective  Blood pressure (!) 150/82, pulse 70, height 5' 2.5" (1.588 m), weight 283 lb (128.4 kg), SpO2 97 %. Systems examined below as of 11/01/17   General: No apparent distress alert and oriented x3 mood and affect normal, dressed appropriately. Obese  HEENT: Pupils equal, extraocular movements intact  Respiratory: Patient's speak in full sentences and does not appear short of breath  Cardiovascular: 2 + lower extremity edema, non tender, no erythema  Skin: Warm dry intact with no signs of infection or rash on extremities or on axial skeleton.  Abdomen: Soft nontender  Neuro: Cranial nerves II through XII are intact, neurovascularly intact in all extremities with 2+ DTRs and  2+ pulses.  Lymph: No lymphadenopathy of posterior or anterior cervical chain or axillae bilaterally.  Gait antalgic gait MSK:  Non tender with full range of motion and good stability and symmetric strength and tone of shoulders, elbows, wrist, hip, and ankles bilaterally.  Knee: Bilateral valgus deformity noted. Large thigh to calf ratio.  Tender to palpation over medial and PF joint line.  ROM full in flexion and extension and lower leg rotation. instability with valgus force.  painful patellar compression. Patellar glide with moderate crepitus. Patellar and quadriceps tendons unremarkable. Hamstring and quadriceps strength is  normal.  After informed written and verbal consent, patient was seated on exam table. Right knee was prepped with alcohol swab and utilizing anterolateral approach, patient's right knee space was injected with 4:1  marcaine 0.5%: Kenalog 40mg /dL. Patient tolerated the procedure well without immediate complications.  After informed written and verbal consent, patient was seated on exam table. Left knee was prepped with alcohol swab and utilizing anterolateral approach, patient's left knee space was injected with 4:1  marcaine 0.5%: Kenalog 40mg /dL. Patient tolerated the procedure well without immediate complications.      Impression and Recommendations:     This case required medical decision making of moderate complexity.      Note: This dictation was prepared with Dragon dictation along with smaller phrase technology. Any transcriptional errors that result from this process are unintentional.

## 2017-11-01 ENCOUNTER — Ambulatory Visit: Payer: BLUE CROSS/BLUE SHIELD | Admitting: Family Medicine

## 2017-11-01 ENCOUNTER — Encounter: Payer: Self-pay | Admitting: Family Medicine

## 2017-11-01 DIAGNOSIS — R600 Localized edema: Secondary | ICD-10-CM | POA: Diagnosis not present

## 2017-11-01 DIAGNOSIS — M170 Bilateral primary osteoarthritis of knee: Secondary | ICD-10-CM

## 2017-11-01 MED ORDER — FUROSEMIDE 20 MG PO TABS: 20.0000 mg | ORAL_TABLET | Freq: Every day | ORAL | 3 refills | Status: DC

## 2017-11-01 NOTE — Assessment & Plan Note (Signed)
Worsening discomfort and pain.  Patient unable to afford the Visco supplementation at this point.  Bilateral steroid injection given today.  We discussed icing regimen and home exercises.  Discussed which activities of doing which wants to avoid.  Patient will follow-up with me again in 4 weeks

## 2017-11-01 NOTE — Patient Instructions (Signed)
Good to see you  Gustavus Bryantce is your friend.  I am sorry you are hurting so  We will hold on the other injection s See me again in 10 weeks otherwise

## 2017-11-01 NOTE — Assessment & Plan Note (Signed)
Mild Lasix given

## 2017-11-07 ENCOUNTER — Encounter

## 2017-11-07 ENCOUNTER — Ambulatory Visit: Payer: BLUE CROSS/BLUE SHIELD | Admitting: Family Medicine

## 2018-01-09 NOTE — Progress Notes (Signed)
[name] [address] [place], [place] [address] Phone: [phone] Subjective:     CC: Bilateral knee pain  WUX:[id number]:Subjective  Jenny [name] is a 67 y.o. female coming in with complaint of bilateral knee pain. States that they are not feeling good.  Patient is failed all conservative therapy including steroid injections within the last 10 weeks.  Patient has had prior approval now for Visco supplementation.  Feels like the pain in the knees is starting to cause worsening symptoms affecting daily activities.  Rates the severity pain is 7 out of 10 on a regular basis and can wake her up at night.      History reviewed. No pertinent past medical history. Past Surgical History:  Procedure Laterality Date  . CHOLECYSTECTOMY    . TONSILLECTOMY     Social History   Socioeconomic History  . Marital status: Divorced    Spouse name: Not on file  . Number of children: Not on file  . Years of education: Not on file  . Highest education level: Not on file  Occupational History  . Not on file  Social Needs  . Financial resource strain: Not on file  . Food insecurity:    Worry: Not on file    Inability: Not on file  . Transportation needs:    Medical: Not on file    Non-medical: Not on file  Tobacco Use  . Smoking status: Never Smoker  . Smokeless tobacco: Never Used  Substance and Sexual Activity  . Alcohol use: No  . Drug use: No  . Sexual activity: Not on file  Lifestyle  . Physical activity:    Days per week: Not on file    Minutes per session: Not on file  . Stress: Not on file  Relationships  . Social connections:    Talks on phone: Not on file    Gets together: Not on file    Attends religious service: Not on file    Active member of club or organization: Not on file    Attends meetings of clubs or organizations: Not on file    Relationship status: Not on file  Other Topics Concern  . Not on file  Social History Narrative  . Not on  file   Allergies  Allergen Reactions  . Demerol Itching  . Meperidine Itching   History reviewed. No pertinent family history.   Past medical history, social, surgical and family history all reviewed in electronic medical record.  No pertanent information unless stated regarding to the chief complaint.   Review of Systems:Review of systems updated and as accurate as of 01/10/18  No headache, visual changes, nausea, vomiting, diarrhea, constipation, dizziness, abdominal pain, skin rash, fevers, chills, night sweats, weight loss, swollen lymph nodes, body aches, joint swelling, muscle aches, chest pain, shortness of breath, mood changes.   Objective  Blood pressure (!) 150/88, pulse 64, height 5' 2.5" (1.588 m), weight 282 lb (127.9 kg), SpO2 97 %. Systems examined below as of 01/10/18   General: No apparent distress alert and oriented x3 mood and affect normal, dressed appropriately.  Morbidly obese HEENT: Pupils equal, extraocular movements intact  Respiratory: Patient's speak in full sentences and does not appear short of breath  Cardiovascular: No lower extremity edema, non tender, no erythema  Skin: Warm dry intact with no signs of infection or rash on extremities or on axial skeleton.  Abdomen: Soft nontender  Neuro: Cranial nerves II through XII are intact,  neurovascularly intact in all extremities with 2+ DTRs and 2+ pulses.  Lymph: No lymphadenopathy of posterior or anterior cervical chain or axillae bilaterally.  Gait antalgic MSK:  Non tender with full range of motion and good stability and symmetric strength and tone of shoulders, elbows, wrist, hip, and ankles bilaterally.   Knee: Bilateral valgus deformity noted. Large thigh to calf ratio.  Tender to palpation over medial and PF joint line.  ROM full in flexion and extension and lower leg rotation. instability with valgus force.  painful patellar compression. Patellar glide with moderate crepitus. Patellar and  quadriceps tendons unremarkable. Hamstring and quadriceps strength is normal.   After informed written and verbal consent, patient was seated on exam table. Right knee was prepped with alcohol swab and utilizing anterolateral approach, patient's right knee space was injected with 4:1  marcaine 0.5%: Kenalog 40mg /dL. Patient tolerated the procedure well without immediate complications.  After informed written and verbal consent, patient was seated on exam table. Left knee was prepped with alcohol swab and utilizing anterolateral approach, patient's left knee space was injected with 4:1  marcaine 0.5%: Kenalog 40mg /dL. Patient tolerated the procedure well without immediate complications.   Impression and Recommendations:     This case required medical decision making of moderate complexity.      Note: This dictation was prepared with Dragon dictation along with smaller phrase technology. Any transcriptional errors that result from this process are unintentional.

## 2018-01-10 ENCOUNTER — Encounter: Payer: Self-pay | Admitting: Family Medicine

## 2018-01-10 ENCOUNTER — Ambulatory Visit: Payer: BLUE CROSS/BLUE SHIELD | Admitting: Family Medicine

## 2018-01-10 DIAGNOSIS — M170 Bilateral primary osteoarthritis of knee: Secondary | ICD-10-CM | POA: Diagnosis not present

## 2018-01-10 NOTE — Patient Instructions (Signed)
Good to see you  Jenny [name] is your friend.  We can start orthovisc and we did today  Stay active First injection may not notice too much  See me again next week and we will get #2 again

## 2018-01-10 NOTE — Assessment & Plan Note (Signed)
Viscosupplementation started today after failing all other conservative therapy.  We discussed icing regimen and home exercises.  Follow-up in 1 week for second in a series of 4 injections bilateral

## 2018-01-17 NOTE — Progress Notes (Signed)
Jenny [name] [address] [place], [place] [address] Phone: [phone] Subjective:     CC: Bilateral knee pain  BJY:[id number]  Jenny [name] is a 67 y.o. female coming in with complaint of bilateral knee pain.  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  Failed all conservative therapy.  Here for second in a series of 4 injections for Visco supplementation.  Has responded well to these previously.     No past medical history on file. Past Surgical History:  Procedure Laterality Date  . CHOLECYSTECTOMY    . TONSILLECTOMY     Social History   Socioeconomic History  . Marital status: Divorced    Spouse name: Not on file  . Number of children: Not on file  . Years of education: Not on file  . Highest education level: Not on file  Occupational History  . Not on file  Social Needs  . Financial resource strain: Not on file  . Food insecurity:    Worry: Not on file    Inability: Not on file  . Transportation needs:    Medical: Not on file    Non-medical: Not on file  Tobacco Use  . Smoking status: Never Smoker  . Smokeless tobacco: Never Used  Substance and Sexual Activity  . Alcohol use: No  . Drug use: No  . Sexual activity: Not on file  Lifestyle  . Physical activity:    Days per week: Not on file    Minutes per session: Not on file  . Stress: Not on file  Relationships  . Social connections:    Talks on phone: Not on file    Gets together: Not on file    Attends religious service: Not on file    Active member of club or organization: Not on file    Attends meetings of clubs or organizations: Not on file    Relationship status: Not on file  Other Topics Concern  . Not on file  Social History Narrative  . Not on file   Allergies  Allergen Reactions  . Demerol Itching  . Meperidine Itching   No family history on file.  Current Outpatient Medications (Endocrine & Metabolic):  .  levothyroxine (SYNTHROID,  LEVOTHROID) 50 MCG tablet, Take by mouth.  Current Outpatient Medications (Cardiovascular):  .  furosemide (LASIX) 20 MG tablet, Take 1 tablet (20 mg total) by mouth daily. .  valsartan (DIOVAN) 80 MG tablet, Take 80 mg by mouth.     Current Outpatient Medications (Other):  Marland Kitchen  Diclofenac Sodium (PENNSAID) 2 % SOLN, Place 2 application onto the skin 2 (two) times daily. .  DULoxetine (CYMBALTA) 60 MG capsule, Take 60 mg by mouth daily.   .  temazepam (RESTORIL) 15 MG capsule, Take 15 mg by mouth.    Past medical history, social, surgical and family history all reviewed in electronic medical record.  No pertanent information unless stated regarding to the chief complaint.   Review of Systems:  No headache, visual changes, nausea, vomiting, diarrhea, constipation, dizziness, abdominal pain, skin rash, fevers, chills, night sweats, weight loss, swollen lymph nodes, body aches, joint swelling,  chest pain, shortness of breath, mood changes.  Positive muscle aches  Objective  Blood pressure 130/84, pulse 70, height 5' 2.5" (1.588 m), SpO2 97 %. Systems examined below as of    General: No apparent distress alert and oriented x3 mood and affect normal, dressed appropriately.  HEENT: Pupils equal, extraocular movements intact  Respiratory: Patient's speak in full sentences and does not appear short of breath  Cardiovascular: No lower extremity edema, non tender, no erythema  Skin: Warm dry intact with no signs of infection or rash on extremities or on axial skeleton.  Abdomen: Soft nontender  Neuro: Cranial nerves II through XII are intact, neurovascularly intact in all extremities with 2+ DTRs and 2+ pulses.  Lymph: No lymphadenopathy of posterior or anterior cervical chain or axillae bilaterally.  Gait antalgic MSK:  Non tender with full range of motion and good stability and symmetric strength and tone of shoulders, elbows, wrist, hip, and ankles bilaterally.   Knee: Bilateral valgus  deformity noted. Large thigh to calf ratio.  Tender to palpation over medial and PF joint line.  ROM full in flexion and extension and lower leg rotation. instability with valgus force.  painful patellar compression. Patellar glide with moderate crepitus. Patellar and quadriceps tendons unremarkable. Hamstring and quadriceps strength is normal.     Impression and Recommendations:     This case required medical decision making of moderate complexity. The above documentation has been reviewed and is accurate and complete Jenny SaaZachary M [name], DO       Note: This dictation was prepared with Dragon dictation along with smaller phrase technology. Any transcriptional errors that result from this process are unintentional.

## 2018-01-18 ENCOUNTER — Encounter: Payer: Self-pay | Admitting: Family Medicine

## 2018-01-18 ENCOUNTER — Ambulatory Visit: Payer: BLUE CROSS/BLUE SHIELD | Admitting: Family Medicine

## 2018-01-18 DIAGNOSIS — M170 Bilateral primary osteoarthritis of knee: Secondary | ICD-10-CM

## 2018-01-18 NOTE — Assessment & Plan Note (Signed)
Second in a series of 4 injections given today.  Discussed icing regimen and home exercise.  Discussed which activities to do which was to avoid.  Follow-up again in 4 to 8 weeks

## 2018-01-18 NOTE — Patient Instructions (Signed)
Good to see you  Jenny [name] is your friend.  Stay active 2 down and 2 to go  See you next week

## 2018-01-25 ENCOUNTER — Encounter: Payer: Self-pay | Admitting: Family Medicine

## 2018-01-25 ENCOUNTER — Ambulatory Visit: Payer: BLUE CROSS/BLUE SHIELD | Admitting: Family Medicine

## 2018-01-25 DIAGNOSIS — M170 Bilateral primary osteoarthritis of knee: Secondary | ICD-10-CM

## 2018-01-25 NOTE — Patient Instructions (Signed)
One more to go! See you in a week!

## 2018-01-25 NOTE — Progress Notes (Signed)
[name] [address] [place], [place] [address] Phone: [phone] Subjective:     I Jenny [name] am serving as a Neurosurgeon for Dr. Antoine Primas.   CC: Bilateral knee pain  [id number]  Jenny [name] is a 67 y.o. female coming in with complaint of bilateral knee pain. Here for bilateral injections. Right knee is hurting "different" on the medial side of her knee. Feels like her knee is going to pop. More of that feeling this past week.       No past medical history on file. Past Surgical History:  Procedure Laterality Date  . CHOLECYSTECTOMY    . TONSILLECTOMY     Social History   Socioeconomic History  . Marital status: Divorced    Spouse name: Not on file  . Number of children: Not on file  . Years of education: Not on file  . Highest education level: Not on file  Occupational History  . Not on file  Social Needs  . Financial resource strain: Not on file  . Food insecurity:    Worry: Not on file    Inability: Not on file  . Transportation needs:    Medical: Not on file    Non-medical: Not on file  Tobacco Use  . Smoking status: Never Smoker  . Smokeless tobacco: Never Used  Substance and Sexual Activity  . Alcohol use: No  . Drug use: No  . Sexual activity: Not on file  Lifestyle  . Physical activity:    Days per week: Not on file    Minutes per session: Not on file  . Stress: Not on file  Relationships  . Social connections:    Talks on phone: Not on file    Gets together: Not on file    Attends religious service: Not on file    Active member of club or organization: Not on file    Attends meetings of clubs or organizations: Not on file    Relationship status: Not on file  Other Topics Concern  . Not on file  Social History Narrative  . Not on file   Allergies  Allergen Reactions  . Demerol Itching  . Meperidine Itching   No family history on file.  Current Outpatient Medications (Endocrine & Metabolic):   .  levothyroxine (SYNTHROID, LEVOTHROID) 50 MCG tablet, Take by mouth.  Current Outpatient Medications (Cardiovascular):  .  furosemide (LASIX) 20 MG tablet, Take 1 tablet (20 mg total) by mouth daily. .  valsartan (DIOVAN) 80 MG tablet, Take 80 mg by mouth.     Current Outpatient Medications (Other):  Marland Kitchen  Diclofenac Sodium (PENNSAID) 2 % SOLN, Place 2 application onto the skin 2 (two) times daily. .  DULoxetine (CYMBALTA) 60 MG capsule, Take 60 mg by mouth daily.   .  temazepam (RESTORIL) 15 MG capsule, Take 15 mg by mouth.    Past medical history, social, surgical and family history all reviewed in electronic medical record.  No pertanent information unless stated regarding to the chief complaint.   Review of Systems:  No headache, visual changes, nausea, vomiting, diarrhea, constipation, dizziness, abdominal pain, skin rash, fevers, chills, night sweats, weight loss, swollen lymph nodes, body aches, , chest pain, shortness of breath, mood changes.  Positive muscle aches and joint swelling  Objective  Blood pressure 134/84, pulse 82, height 5' 2.5" (1.588 m), weight 283 lb (128.4 kg), SpO2 97 %.     General: No apparent distress alert and oriented  x3 mood and affect normal, dressed appropriately.  HEENT: Pupils equal, extraocular movements intact  Respiratory: Patient's speak in full sentences and does not appear short of breath  Cardiovascular: No lower extremity edema, non tender, no erythema  Skin: Warm dry intact with no signs of infection or rash on extremities or on axial skeleton.  Abdomen: Soft nontender  Neuro: Cranial nerves II through XII are intact, neurovascularly intact in all extremities with 2+ DTRs and 2+ pulses.  Lymph: No lymphadenopathy of posterior or anterior cervical chain or axillae bilaterally.  Gait antalgic MSK:  Non tender with full range of motion and good stability and symmetric strength and tone of shoulders, elbows, wrist, hip, and ankles  bilaterally.  Knee: Bilateral valgus deformity noted. Large thigh to calf ratio.  Tender to palpation over medial and PF joint line.  ROM full in flexion and extension and lower leg rotation. instability with valgus force.  painful patellar compression. Patellar glide with moderate crepitus. Patellar and quadriceps tendons unremarkable. Hamstring and quadriceps strength is normal.   After informed written and verbal consent, patient was seated on exam table. Right knee was prepped with alcohol swab and utilizing anterolateral approach, patient's right knee space was injected with15 mg/2.5 mL of Orthovisc(sodium hyaluronate) in a prefilled syringe was injected easily into the knee through a 22-gauge needle..Patient tolerated the procedure well without immediate complications.  After informed written and verbal consent, patient was seated on exam table. Left knee was prepped with alcohol swab and utilizing anterolateral approach, patient's left knee space was injected with15 mg/2.5 mL of Orthovisc(sodium hyaluronate) in a prefilled syringe was injected easily into the knee through a 22-gauge needle..Patient tolerated the procedure well without immediate complications.   Impression and Recommendations:     This case required medical decision making of moderate complexity. The above documentation has been reviewed and is accurate and complete Judi Saa, DO       Note: This dictation was prepared with Dragon dictation along with smaller phrase technology. Any transcriptional errors that result from this process are unintentional.

## 2018-01-25 NOTE — Assessment & Plan Note (Signed)
Patient has had Visco supplementation.  Patient has had third injection today.  Tolerated the procedure well.  Patient will come back in 1 week for fourth and final injections of the knees bilaterally for Visco supplementation.  Continue conservative therapy otherwise

## 2018-01-31 NOTE — Progress Notes (Signed)
[name] [address] [place], [place] [address] Phone: [phone] Subjective:    I Ronelle Nigh am serving as a Neurosurgeon for Dr. Antoine Primas.   CC: Bilateral knee pain  WFU:[id number]  Jenny [name] is a 67 y.o. female coming in with complaint of bilateral knee pain. Bilateral orthovisc. States that her knees are doing better. Medial right knee still painful.  Patient does have more arthritic changes.  This is the fourth and final Visco supplementation      No past medical history on file. Past Surgical History:  Procedure Laterality Date  . CHOLECYSTECTOMY    . TONSILLECTOMY     Social History   Socioeconomic History  . Marital status: Divorced    Spouse name: Not on file  . Number of children: Not on file  . Years of education: Not on file  . Highest education level: Not on file  Occupational History  . Not on file  Social Needs  . Financial resource strain: Not on file  . Food insecurity:    Worry: Not on file    Inability: Not on file  . Transportation needs:    Medical: Not on file    Non-medical: Not on file  Tobacco Use  . Smoking status: Never Smoker  . Smokeless tobacco: Never Used  Substance and Sexual Activity  . Alcohol use: No  . Drug use: No  . Sexual activity: Not on file  Lifestyle  . Physical activity:    Days per week: Not on file    Minutes per session: Not on file  . Stress: Not on file  Relationships  . Social connections:    Talks on phone: Not on file    Gets together: Not on file    Attends religious service: Not on file    Active member of club or organization: Not on file    Attends meetings of clubs or organizations: Not on file    Relationship status: Not on file  Other Topics Concern  . Not on file  Social History Narrative  . Not on file   Allergies  Allergen Reactions  . Demerol Itching  . Meperidine Itching   No family history on file.  Current Outpatient Medications  (Endocrine & Metabolic):  .  levothyroxine (SYNTHROID, LEVOTHROID) 50 MCG tablet, Take by mouth.  Current Outpatient Medications (Cardiovascular):  .  furosemide (LASIX) 20 MG tablet, Take 1 tablet (20 mg total) by mouth daily. .  valsartan (DIOVAN) 80 MG tablet, Take 80 mg by mouth.     Current Outpatient Medications (Other):  Marland Kitchen  Diclofenac Sodium (PENNSAID) 2 % SOLN, Place 2 application onto the skin 2 (two) times daily. .  DULoxetine (CYMBALTA) 60 MG capsule, Take 60 mg by mouth daily.   .  temazepam (RESTORIL) 15 MG capsule, Take 15 mg by mouth.    Past medical history, social, surgical and family history all reviewed in electronic medical record.  No pertanent information unless stated regarding to the chief complaint.   Review of Systems:  No headache, visual changes, nausea, vomiting, diarrhea, constipation, dizziness, abdominal pain, skin rash, fevers, chills, night sweats, weight loss, swollen lymph nodes, body aches, joint swelling, muscle aches, chest pain, shortness of breath, mood changes.   Objective  There were no vitals taken for this visit.   General: No apparent distress alert and oriented x3 mood and affect normal, dressed appropriately.  HEENT: Pupils equal, extraocular movements intact  Respiratory: Patient's speak  in full sentences and does not appear short of breath  Cardiovascular: No lower extremity edema, non tender, no erythema  Skin: Warm dry intact with no signs of infection or rash on extremities or on axial skeleton.  Abdomen: Soft nontender morbidly obese Neuro: Cranial nerves II through XII are intact, neurovascularly intact in all extremities with 2+ DTRs and 2+ pulses.  Lymph: No lymphadenopathy of posterior or anterior cervical chain or axillae bilaterally.  Gait antalgic MSK:  Non tender with full range of motion and good stability and symmetric strength and tone of shoulders, elbows, wrist, hip, and ankles bilaterally.  Knee: Bilateral valgus  deformity noted. Large thigh to calf ratio.  Difficult to assess secondary to patient's body habitus Tender to palpation over medial and PF joint line.  Less than previous exam ROM full in flexion and extension and lower leg rotation. instability with valgus force.  painful patellar compression. Patellar glide with moderate crepitus. Patellar and quadriceps tendons unremarkable. Hamstring and quadriceps strength is normal.  After informed written and verbal consent, patient was seated on exam table. Right knee was prepped with alcohol swab and utilizing anterolateral approach, patient's right knee space was injected with15 mg/2.5 mL of Orthovisc(sodium hyaluronate) in a prefilled syringe was injected easily into the knee through a 22-gauge needle..Patient tolerated the procedure well without immediate complications.  After informed written and verbal consent, patient was seated on exam table. Left knee was prepped with alcohol swab and utilizing anterolateral approach, patient's left knee space was injected with15 mg/2.5 mL of Orthovisc(sodium hyaluronate) in a prefilled syringe was injected easily into the knee through a 22-gauge needle..Patient tolerated the procedure well without immediate complications.    Impression and Recommendations:     This case required medical decision making of moderate complexity. The above documentation has been reviewed and is accurate and complete Judi Saa, DO       Note: This dictation was prepared with Dragon dictation along with smaller phrase technology. Any transcriptional errors that result from this process are unintentional.

## 2018-02-01 ENCOUNTER — Encounter: Payer: Self-pay | Admitting: Family Medicine

## 2018-02-01 ENCOUNTER — Ambulatory Visit: Payer: BLUE CROSS/BLUE SHIELD | Admitting: Family Medicine

## 2018-02-01 DIAGNOSIS — M170 Bilateral primary osteoarthritis of knee: Secondary | ICD-10-CM

## 2018-02-01 NOTE — Patient Instructions (Addendum)
Good to see you  Ice 20 minutes 2 times daily. Usually after activity and before bed. YOu are done with me for a while Call [phone] If you need [place]!

## 2018-02-01 NOTE — Assessment & Plan Note (Signed)
Pinna supplementation.  Discussed icing regimen and home exercise.  Discussed encouraged weight loss follow-up again in 4 weeks

## 2018-02-03 ENCOUNTER — Other Ambulatory Visit: Payer: Self-pay | Admitting: Family Medicine

## 2018-08-20 NOTE — Progress Notes (Signed)
[name] [address] [place], [place] [address] Phone: [phone] Subjective:   [name], am serving as a scribe for Dr. Antoine Primas.   CC: Bilateral knee pain  UUE:[id number]  Jenny [name] is a 68 y.o. female coming in with complaint of bilateral knee pain. Patient was last seen in November 2019 for steroid injections. Patient states worsening pain at this point.  Did respond very well to Visco supplementation.  Patient's normal over the course last month and started having worsening pain again.  States is some increasing instability.  Feels like there is been some swelling and losing some range of motion.  Sometimes keeps her up at night.  Over-the-counter medications are not helping significantly.  Try to stay active but finds it difficult secondary to pain.      No past medical history on file. Past Surgical History:  Procedure Laterality Date  . CHOLECYSTECTOMY    . TONSILLECTOMY     Social History   Socioeconomic History  . Marital status: Divorced    Spouse name: Not on file  . Number of children: Not on file  . Years of education: Not on file  . Highest education level: Not on file  Occupational History  . Not on file  Social Needs  . Financial resource strain: Not on file  . Food insecurity:    Worry: Not on file    Inability: Not on file  . Transportation needs:    Medical: Not on file    Non-medical: Not on file  Tobacco Use  . Smoking status: Never Smoker  . Smokeless tobacco: Never Used  Substance and Sexual Activity  . Alcohol use: No  . Drug use: No  . Sexual activity: Not on file  Lifestyle  . Physical activity:    Days per week: Not on file    Minutes per session: Not on file  . Stress: Not on file  Relationships  . Social connections:    Talks on phone: Not on file    Gets together: Not on file    Attends religious service: Not on file    Active member of club or organization: Not on file   Attends meetings of clubs or organizations: Not on file    Relationship status: Not on file  Other Topics Concern  . Not on file  Social History Narrative  . Not on file   Allergies  Allergen Reactions  . Demerol Itching  . Meperidine Itching   No family history on file.  Current Outpatient Medications (Endocrine & Metabolic):  .  levothyroxine (SYNTHROID, LEVOTHROID) 50 MCG tablet, Take by mouth.  Current Outpatient Medications (Cardiovascular):  .  furosemide (LASIX) 20 MG tablet, TAKE 1 TABLET BY MOUTH EVERY DAY .  valsartan (DIOVAN) 80 MG tablet, Take 80 mg by mouth.     Current Outpatient Medications (Other):  Marland Kitchen  Diclofenac Sodium (PENNSAID) 2 % SOLN, Place 2 application onto the skin 2 (two) times daily. .  DULoxetine (CYMBALTA) 60 MG capsule, Take 60 mg by mouth daily.   .  temazepam (RESTORIL) 15 MG capsule, Take 15 mg by mouth.    Past medical history, social, surgical and family history all reviewed in electronic medical record.  No pertanent information unless stated regarding to the chief complaint.   Review of Systems:  No headache, visual changes, nausea, vomiting, diarrhea, constipation, dizziness, abdominal pain, skin rash, fevers, chills, night sweats, weight loss, swollen lymph nodes, body aches,  joint swelling,  chest pain, shortness of breath, mood changes.  Positive muscle aches  Objective  Blood pressure 122/60, pulse 71, height 5' 2.5" (1.588 m), weight 282 lb (127.9 kg), SpO2 96 %.f    General: No apparent distress alert and oriented x3 mood and affect normal, dressed appropriately.  Morbidly obese HEENT: Pupils equal, extraocular movements intact  Respiratory: Patient's speak in full sentences and does not appear short of breath  Cardiovascular: No lower extremity edema, non tender, no erythema  Skin: Warm dry intact with no signs of infection or rash on extremities or on axial skeleton.  Abdomen: Soft nontender  Neuro: Cranial nerves II through  XII are intact, neurovascularly intact in all extremities with 2+ DTRs and 2+ pulses.  Lymph: No lymphadenopathy of posterior or anterior cervical chain or axillae bilaterally.  Gait antalgic MSK:  tender with full range of motion and good stability and symmetric strength and tone of shoulders, elbows, wrist, hip, and ankles bilaterally.  Knee: Bilateral valgus deformity noted.  Abnormal thigh to calf ratio.  Tender to palpation over medial and PF joint line.  ROM full in flexion and extension and lower leg rotation. instability with valgus force.  painful patellar compression. Patellar glide with moderate crepitus. Patellar and quadriceps tendons unremarkable. Hamstring and quadriceps strength is normal.   After informed written and verbal consent, patient was seated on exam table. Right knee was prepped with alcohol swab and utilizing anterolateral approach, patient's right knee space was injected with 4:1  marcaine 0.5%: Kenalog 40mg /dL. Patient tolerated the procedure well without immediate complications.  After informed written and verbal consent, patient was seated on exam table. Left knee was prepped with alcohol swab and utilizing anterolateral approach, patient's left knee space was injected with 4:1  marcaine 0.5%: Kenalog 40mg /dL. Patient tolerated the procedure well without immediate complications.   Impression and Recommendations:     This case required medical decision making of moderate complexity. The above documentation has been reviewed and is accurate and complete Judi Saa, DO       Note: This dictation was prepared with Dragon dictation along with smaller phrase technology. Any transcriptional errors that result from this process are unintentional.

## 2018-08-21 ENCOUNTER — Ambulatory Visit: Payer: Medicare Other | Admitting: Family Medicine

## 2018-08-21 ENCOUNTER — Other Ambulatory Visit: Payer: Self-pay

## 2018-08-21 ENCOUNTER — Encounter: Payer: Self-pay | Admitting: Family Medicine

## 2018-08-21 DIAGNOSIS — M170 Bilateral primary osteoarthritis of knee: Secondary | ICD-10-CM

## 2018-08-21 NOTE — Assessment & Plan Note (Signed)
Bilateral injections given.  Discussed icing regimen and home exercise.  Discussed which activities to do which wants to avoid.  Patient did respond very well to Visco supplementation will try to get prior authorization again.  Worsening pain patient is to call us.  Discussed with patient I would like to hold out though as long as possible secondary to coronavirus.

## 2018-08-21 NOTE — Patient Instructions (Signed)
Good to see you  steroid injections today  Will try to get approval on the other injections when we can.  Ice 20 minutes 2 times daily. Usually after activity and before bed. Try to stay active Stay safe  See me again in 6 weeks

## 2018-09-05 ENCOUNTER — Ambulatory Visit: Payer: BLUE CROSS/BLUE SHIELD | Admitting: Family Medicine

## 2018-10-02 NOTE — Progress Notes (Deleted)
[name] [address] [place], [place] [address] Phone: [phone] Subjective:    I'm seeing this patient by the request  of:    CC: bilateral knee pain   WIO:[id number]  Jenny [name] is a 68 y.o. female coming in with complaint of ***  Onset-  Location Duration-  Character- Aggravating factors- Reliving factors-  Therapies tried-  Severity-     No past medical history on file. Past Surgical History:  Procedure Laterality Date  . CHOLECYSTECTOMY    . TONSILLECTOMY     Social History   Socioeconomic History  . Marital status: Divorced    Spouse name: Not on file  . Number of children: Not on file  . Years of education: Not on file  . Highest education level: Not on file  Occupational History  . Not on file  Social Needs  . Financial resource strain: Not on file  . Food insecurity:    Worry: Not on file    Inability: Not on file  . Transportation needs:    Medical: Not on file    Non-medical: Not on file  Tobacco Use  . Smoking status: Never Smoker  . Smokeless tobacco: Never Used  Substance and Sexual Activity  . Alcohol use: No  . Drug use: No  . Sexual activity: Not on file  Lifestyle  . Physical activity:    Days per week: Not on file    Minutes per session: Not on file  . Stress: Not on file  Relationships  . Social connections:    Talks on phone: Not on file    Gets together: Not on file    Attends religious service: Not on file    Active member of club or organization: Not on file    Attends meetings of clubs or organizations: Not on file    Relationship status: Not on file  Other Topics Concern  . Not on file  Social History Narrative  . Not on file   Allergies  Allergen Reactions  . Demerol Itching  . Meperidine Itching   No family history on file.  Current Outpatient Medications (Endocrine & Metabolic):  .  levothyroxine (SYNTHROID, LEVOTHROID) 50 MCG tablet, Take by mouth.  Current  Outpatient Medications (Cardiovascular):  .  furosemide (LASIX) 20 MG tablet, TAKE 1 TABLET BY MOUTH EVERY DAY .  valsartan (DIOVAN) 80 MG tablet, Take 80 mg by mouth.     Current Outpatient Medications (Other):  Marland Kitchen  Diclofenac Sodium (PENNSAID) 2 % SOLN, Place 2 application onto the skin 2 (two) times daily. .  DULoxetine (CYMBALTA) 60 MG capsule, Take 60 mg by mouth daily.   .  temazepam (RESTORIL) 15 MG capsule, Take 15 mg by mouth.    Past medical history, social, surgical and family history all reviewed in electronic medical record.  No pertanent information unless stated regarding to the chief complaint.   Review of Systems:  No headache, visual changes, nausea, vomiting, diarrhea, constipation, dizziness, abdominal pain, skin rash, fevers, chills, night sweats, weight loss, swollen lymph nodes, body aches, joint swelling, muscle aches, chest pain, shortness of breath, mood changes.   Objective  There were no vitals taken for this visit. Systems examined below as of    General: No apparent distress alert and oriented x3 mood and affect normal, dressed appropriately.  HEENT: Pupils equal, extraocular movements intact  Respiratory: Patient's speak in full sentences and does not appear short of breath  Cardiovascular: No lower  extremity edema, non tender, no erythema  Skin: Warm dry intact with no signs of infection or rash on extremities or on axial skeleton.  Abdomen: Soft nontender  Neuro: Cranial nerves II through XII are intact, neurovascularly intact in all extremities with 2+ DTRs and 2+ pulses.  Lymph: No lymphadenopathy of posterior or anterior cervical chain or axillae bilaterally.  Gait normal with good balance and coordination.  MSK:  Non tender with full range of motion and good stability and symmetric strength and tone of shoulders, elbows, wrist, hip and ankles bilaterally.   Knee: valgus deformity noted. Large thigh to calf ratio.  Tender to palpation over medial  and PF joint line.  ROM full in flexion and extension and lower leg rotation. instability with valgus force.  painful patellar compression. Patellar glide with moderate crepitus. Patellar and quadriceps tendons unremarkable. Hamstring and quadriceps strength is normal.       Impression and Recommendations:     This case required medical decision making of moderate complexity. The above documentation has been reviewed and is accurate and complete Judi SaaZachary M Ichelle Harral, DO       Note: This dictation was prepared with Dragon dictation along with smaller phrase technology. Any transcriptional errors that result from this process are unintentional.

## 2018-10-03 ENCOUNTER — Ambulatory Visit: Payer: Medicare Other | Admitting: Family Medicine

## 2018-10-04 ENCOUNTER — Encounter: Payer: Self-pay | Admitting: Family Medicine

## 2018-10-04 ENCOUNTER — Other Ambulatory Visit: Payer: Self-pay

## 2018-10-04 ENCOUNTER — Ambulatory Visit: Payer: Medicare Other | Admitting: Family Medicine

## 2018-10-04 DIAGNOSIS — M170 Bilateral primary osteoarthritis of knee: Secondary | ICD-10-CM | POA: Diagnosis not present

## 2018-10-04 NOTE — Assessment & Plan Note (Signed)
Given viscosupplementation today.  Hopefully patient will have as much improvement as she did previously.  We discussed icing regimen and home exercises the importance of weight loss.  We discussed shoe choices and avoiding walking.  Patient will follow-up with me again in 4 to 6 weeks.  Patient was in the office and we did discuss the coronavirus.  Discussed for quite some time greater than 25 minutes of face-to-face

## 2018-10-04 NOTE — Patient Instructions (Signed)
Good to see you  Jenny [name] is your friend.  May take a month to feel compete benefit  See me again in 4 weeks if you need me

## 2018-10-04 NOTE — Progress Notes (Signed)
Jenny [name] D.O. [facility] [address] [place], [place] [id number] Phone: [phone] Subjective:   I Jenny [name] am serving as a Neurosurgeonscribe for Jenny [name].    CC: bilateral knee pain   NGE:[id number]:Subjective   08/21/2018 Bilateral injections given.  Discussed icing regimen and home exercise.  Discussed which activities to do which wants to avoid.  Patient did respond very well to Visco supplementation will try to get prior authorization again.  Worsening pain patient is to call us.  Discussed with patient I would like to hold out though as long as possible secondary to coronavirus.  10/04/2018 Jenny [name] is a 68 y.o. female coming in with complaint of bilateral knee pain. Has been having more pain on her right knee than the left.  Patient has known severe arthritis of the knees bilaterally.  Has failed all conservative therapy.  Did do viscosupplementation in September.  Patient is looking to potentially have this done again.  Patient is starting to walk on a more regular basis.  Has not been losing weight yet    No past medical history on file. Past Surgical History:  Procedure Laterality Date  . CHOLECYSTECTOMY    . TONSILLECTOMY     Social History   Socioeconomic History  . Marital status: Divorced    Spouse name: Not on file  . Number of children: Not on file  . Years of education: Not on file  . Highest education level: Not on file  Occupational History  . Not on file  Social Needs  . Financial resource strain: Not on file  . Food insecurity:    Worry: Not on file    Inability: Not on file  . Transportation needs:    Medical: Not on file    Non-medical: Not on file  Tobacco Use  . Smoking status: Never Smoker  . Smokeless tobacco: Never Used  Substance and Sexual Activity  . Alcohol use: No  . Drug use: No  . Sexual activity: Not on file  Lifestyle  . Physical activity:    Days per week: Not on file    Minutes per session: Not on file  .  Stress: Not on file  Relationships  . Social connections:    Talks on phone: Not on file    Gets together: Not on file    Attends religious service: Not on file    Active member of club or organization: Not on file    Attends meetings of clubs or organizations: Not on file    Relationship status: Not on file  Other Topics Concern  . Not on file  Social History Narrative  . Not on file   Allergies  Allergen Reactions  . Demerol Itching  . Meperidine Itching   No family history on file.  Current Outpatient Medications (Endocrine & Metabolic):  .  levothyroxine (SYNTHROID, LEVOTHROID) 50 MCG tablet, Take by mouth.  Current Outpatient Medications (Cardiovascular):  .  furosemide (LASIX) 20 MG tablet, TAKE 1 TABLET BY MOUTH EVERY DAY .  valsartan (DIOVAN) 80 MG tablet, Take 80 mg by mouth.     Current Outpatient Medications (Other):  Marland Kitchen.  Diclofenac Sodium (PENNSAID) 2 % SOLN, Place 2 application onto the skin 2 (two) times daily. .  DULoxetine (CYMBALTA) 60 MG capsule, Take 60 mg by mouth daily.   .  temazepam (RESTORIL) 15 MG capsule, Take 15 mg by mouth.    Past medical history, social, surgical and family history all reviewed  in electronic medical record.  No pertanent information unless stated regarding to the chief complaint.   Review of Systems:  No headache, visual changes, nausea, vomiting, diarrhea, constipation, dizziness, abdominal pain, skin rash, fevers, chills, night sweats, weight loss, swollen lymph nodes, body aches, , chest pain, shortness of breath, mood changes.  Positive muscle aches and joint swelling  Objective  Blood pressure 122/76, pulse 68, height 5' 2.5" (1.588 m), weight 284 lb (128.8 kg), SpO2 96 %.   General: No apparent distress alert and oriented x3 mood and affect normal, dressed appropriately.  Morbidly obese HEENT: Pupils equal, extraocular movements intact  Respiratory: Patient's speak in full sentences and does not appear short of breath   Cardiovascular: 1+ lower extremity edema, non tender, no erythema  Skin: Warm dry intact with no signs of infection or rash on extremities or on axial skeleton.  Abdomen: Soft nontender  Neuro: Cranial nerves II through XII are intact, neurovascularly intact in all extremities with 2+ DTRs and 2+ pulses.  Lymph: No lymphadenopathy of posterior or anterior cervical chain or axillae bilaterally.  antalgic gait MSK:  Non tender with full range of motion and good stability and symmetric strength and tone of shoulders, elbows, wrist, hip and ankles bilaterally.   Knee:bilateral  valgus deformity noted. Large thigh to calf ratio.  Tender to palpation over medial and PF joint line.  ROM full in flexion and extension and lower leg rotation. instability with valgus force.  painful patellar compression. Patellar glide with moderate crepitus. Patellar and quadriceps tendons unremarkable. Hamstring and quadriceps strength is normal.   After informed written and verbal consent, patient was seated on exam table. Right knee was prepped with alcohol swab and utilizing anterolateral approach, patient's right knee space was injected with of durolene (sodium hyaluronate) in a prefilled syringe was injected easily into the knee through a 22-gauge needle..Patient tolerated the procedure well without immediate complications.  After informed written and verbal consent, patient was seated on exam table. Left knee was prepped with alcohol swab and utilizing anterolateral approach, patient's left knee space was injected with of Durolene(sodium hyaluronate) in a prefilled syringe was injected easily into the knee through a 22-gauge needle..Patient tolerated the procedure well without immediate complications.    Impression and Recommendations:     This case required medical decision making of moderate complexity. The above documentation has been reviewed and is accurate and complete Jenny Saa, DO        Note: This dictation was prepared with Dragon dictation along with smaller phrase technology. Any transcriptional errors that result from this process are unintentional.

## 2019-01-18 ENCOUNTER — Encounter: Payer: Self-pay | Admitting: Family Medicine

## 2019-01-18 ENCOUNTER — Ambulatory Visit: Payer: Medicare Other | Admitting: Family Medicine

## 2019-01-18 ENCOUNTER — Other Ambulatory Visit: Payer: Self-pay

## 2019-01-18 DIAGNOSIS — M170 Bilateral primary osteoarthritis of knee: Secondary | ICD-10-CM

## 2019-01-18 NOTE — Assessment & Plan Note (Signed)
Bilateral steroid injection given today.  We will consider the possibility of PRP injections in the future.  Patient is to continue conservative therapy otherwise.  Discussed posture and ergonomics, home exercises and icing regimen, follow-up 4 to 8 weeks

## 2019-01-18 NOTE — Patient Instructions (Addendum)
Injected both knees today Read about PRP See me in 3-4 weeks

## 2019-01-18 NOTE — Progress Notes (Signed)
Tawana ScaleZach Jamira Barfuss D.O.  Sports Medicine [address] [place], [place] [address] Phone: [phone] Subjective:   [name], [name], am serving as a scribe for Dr. Antoine PrimasZachary Tayo Maute.   I'm seeing this patient by the request  of:    CC: Bilateral knee pain  BJY:[id number]:Subjective   10/04/2018 Given viscosupplementation today.  Hopefully patient will have as much improvement as she did previously.  We discussed icing regimen and home exercises the importance of weight loss.  We discussed shoe choices and avoiding walking.  Patient will follow-up with me again in 4 to 6 weeks.  Patient was in the office and we did discuss the coronavirus.  Discussed for quite some time greater than 25 minutes of face-to-face  Update 01/18/2019 Emeline GinsSherry Montilla is a 68 y.o. female coming in with complaint of bilateral knee pain. Patient states that the last injection did not work. Knee pain continues since the injection.  Patient states that the pain is affecting daily activities, has recently lost her mom and thinks that it is exacerbating it with patient having to include cleaning up the house.  Patient rates the severity of pain is 7 out of 10 with some increasing instability.    No past medical history on file. Past Surgical History:  Procedure Laterality Date  . CHOLECYSTECTOMY    . TONSILLECTOMY     Social History   Socioeconomic History  . Marital status: Divorced    Spouse name: Not on file  . Number of children: Not on file  . Years of education: Not on file  . Highest education level: Not on file  Occupational History  . Not on file  Social Needs  . Financial resource strain: Not on file  . Food insecurity    Worry: Not on file    Inability: Not on file  . Transportation needs    Medical: Not on file    Non-medical: Not on file  Tobacco Use  . Smoking status: Never Smoker  . Smokeless tobacco: Never Used  Substance and Sexual Activity  . Alcohol use: No  . Drug use: No  . Sexual activity: Not  on file  Lifestyle  . Physical activity    Days per week: Not on file    Minutes per session: Not on file  . Stress: Not on file  Relationships  . Social Musicianconnections    Talks on phone: Not on file    Gets together: Not on file    Attends religious service: Not on file    Active member of club or organization: Not on file    Attends meetings of clubs or organizations: Not on file    Relationship status: Not on file  Other Topics Concern  . Not on file  Social History Narrative  . Not on file   Allergies  Allergen Reactions  . Demerol Itching  . Meperidine Itching   No family history on file.  Current Outpatient Medications (Endocrine & Metabolic):  .  levothyroxine (SYNTHROID, LEVOTHROID) 50 MCG tablet, Take by mouth.  Current Outpatient Medications (Cardiovascular):  .  furosemide (LASIX) 20 MG tablet, TAKE 1 TABLET BY MOUTH EVERY DAY .  valsartan (DIOVAN) 80 MG tablet, Take 80 mg by mouth.     Current Outpatient Medications (Other):  Marland Kitchen.  Diclofenac Sodium (PENNSAID) 2 % SOLN, Place 2 application onto the skin 2 (two) times daily. .  DULoxetine (CYMBALTA) 60 MG capsule, Take 60 mg by mouth daily.   .  temazepam (RESTORIL)  15 MG capsule, Take 15 mg by mouth.    Past medical history, social, surgical and family history all reviewed in electronic medical record.  No pertanent information unless stated regarding to the chief complaint.   Review of Systems:  No headache, visual changes, nausea, vomiting, diarrhea, constipation, dizziness, abdominal pain, skin rash, fevers, chills, night sweats, weight loss, swollen lymph nodes, body aches, joint swelling chest pain, shortness of breath, mood changes.  Positive muscle aches  Objective  Blood pressure 110/62, pulse 80, height 5' 2.5" (1.588 m), weight 280 lb (127 kg), SpO2 96 %.    General: No apparent distress alert and oriented x3 mood and affect normal, dressed appropriately.  HEENT: Pupils equal, extraocular movements  intact  Respiratory: Patient's speak in full sentences and does not appear short of breath  Cardiovascular: 1+ lower extremity edema, non tender, no erythema  Skin: Warm dry intact with no signs of infection or rash on extremities or on axial skeleton.  Abdomen: Soft nontender  Neuro: Cranial nerves II through XII are intact, neurovascularly intact in all extremities with 2+ DTRs and 2+ pulses.  Lymph: No lymphadenopathy of posterior or anterior cervical chain or axillae bilaterally.  Gait antalgic gait MSK:  Non tender with full range of motion and good stability and symmetric strength and tone of shoulders, elbows, wrist, hip and ankles bilaterally.  Knee: Bilateral valgus deformity noted. Large thigh to calf ratio.  Tender to palpation over medial and PF joint line.  ROM full in flexion and extension and lower leg rotation. instability with valgus force.  painful patellar compression. Patellar glide with moderate crepitus. Patellar and quadriceps tendons unremarkable. Hamstring and quadriceps strength is normal.   After informed written and verbal consent, patient was seated on exam table. Right knee was prepped with alcohol swab and utilizing anterolateral approach, patient's right knee space was injected with 4:1  marcaine 0.5%: Kenalog 40mg /dL. Patient tolerated the procedure well without immediate complications.  After informed written and verbal consent, patient was seated on exam table. Left knee was prepped with alcohol swab and utilizing anterolateral approach, patient's left knee space was injected with 4:1  marcaine 0.5%: Kenalog 40mg /dL. Patient tolerated the procedure well without immediate complications.    Impression and Recommendations:     This case required medical decision making of moderate complexity. The above documentation has been reviewed and is accurate and complete Lyndal Pulley, DO       Note: This dictation was prepared with Dragon dictation along  with smaller phrase technology. Any transcriptional errors that result from this process are unintentional.

## 2019-01-22 ENCOUNTER — Telehealth: Payer: Self-pay | Admitting: *Deleted

## 2019-01-22 NOTE — Telephone Encounter (Signed)
Pt left msg requesting a call back in regards to her PRP injection on 02/05/2019. She has questions about the process and what she can/cannot do after the injection.

## 2019-01-23 NOTE — Telephone Encounter (Signed)
Spoke with patient. She asked about returning to work after injection. Ok as long as she isn't too active. Patient states that she sits at work.

## 2019-02-04 NOTE — Progress Notes (Signed)
[name] Jenny Jenny [name] Jenny Jenny [name], [place] [address] Phone: [phone] Subjective:   Jenny Jenny [name], am serving as a scribe for Dr. Hulan Saas.   CC: Knee pain follow-up  AJO:[id number]  Jenny Jenny [name] is a 68 y.o. female coming in with complaint of knee pain. Patient is here today for PRP. Patient states  Doing relatively well.  More of the right knee is the only one giving her trouble at the moment.  Here for PRP injection.     Jenny Jenny [name] on file. Past Surgical Jenny [name]:  Procedure Laterality Date  . CHOLECYSTECTOMY    . TONSILLECTOMY     Social Jenny [name]   Socioeconomic Jenny [name]  . Marital status: Divorced    Spouse name: Not on file  . Number of children: Not on file  . Years of education: Not on file  . Highest education level: Not on file  Occupational Jenny [name]  . Not on file  Social Needs  . Financial resource strain: Not on file  . Food insecurity    Worry: Not on file    Inability: Not on file  . Transportation needs    Medical: Not on file    Non-medical: Not on file  Tobacco Use  . Smoking status: Never Smoker  . Smokeless tobacco: Never Used  Substance and Sexual Activity  . Alcohol use: Jenny [name]  . Drug use: Jenny [name]  . Sexual activity: Not on file  Lifestyle  . Physical activity    Days per week: Not on file    Minutes per session: Not on file  . Stress: Not on file  Relationships  . Social Herbalist on phone: Not on file    Gets together: Not on file    Attends religious service: Not on file    Active member of club or organization: Not on file    Attends meetings of clubs or organizations: Not on file    Relationship status: Not on file  Other Topics Concern  . Not on file  Social Jenny [name] Narrative  . Not on file   Allergies  Allergen Reactions  . Demerol Itching  . Meperidine Itching   Jenny Jenny [name] on file.  Current Outpatient Medications (Endocrine & Metabolic):  .  levothyroxine  (SYNTHROID, LEVOTHROID) 50 MCG tablet, Take by mouth.  Current Outpatient Medications (Cardiovascular):  .  furosemide (LASIX) 20 MG tablet, TAKE 1 TABLET BY MOUTH EVERY DAY .  valsartan (DIOVAN) 80 MG tablet, Take 80 mg by mouth.     Current Outpatient Medications (Other):  Marland Kitchen  Diclofenac Sodium (PENNSAID) 2 % SOLN, Place 2 application onto the skin 2 (two) times daily. .  DULoxetine (CYMBALTA) 60 MG capsule, Take 60 mg by mouth daily.   .  temazepam (RESTORIL) 15 MG capsule, Take 15 mg by mouth.    Past medical Jenny [name], social, surgical and family Jenny [name] all reviewed in electronic medical record.  Jenny [name] pertanent information unless stated regarding to the chief complaint.   Review of Systems:  Jenny [name] headache, visual changes, nausea, vomiting, diarrhea, constipation, dizziness, abdominal pain, skin rash, fevers, chills, night sweats, weight loss, swollen lymph nodes, body aches, joint swelling, muscle aches, chest pain, shortness of breath, mood changes.   Objective  There were Jenny [name] vitals taken for this visit. Systems examined below as of    General: Jenny [name] apparent distress alert and oriented x3 mood and affect normal, dressed appropriately.  HEENT: Pupils equal, extraocular movements intact  Respiratory: Patient's speak in full sentences and does not appear short of breath  Cardiovascular: Jenny [name] lower extremity edema, non tender, Jenny [name] erythema  Skin: Warm dry intact with Jenny [name] signs of infection or rash on extremities or on axial skeleton.  Abdomen: Soft nontender  Neuro: Cranial nerves II through XII are intact, neurovascularly intact in all extremities with 2+ DTRs and 2+ pulses.  Lymph: Jenny [name] lymphadenopathy of posterior or anterior cervical chain or ax antalgic MSK:  Non tender with full range of motion and good stability and symmetric strength and tone of shoulders, elbows, wrist, hip nand ankles bilaterally.  Knee: Bilateral  valgus deformity noted. Large thigh to calf ratio.  Tender to  palpation over medial and PF joint line.  ROM full in flexion and extension and lower leg rotation. instability with valgus force.  painful patellar compression. Patellar glide with moderate crepitus. Patellar and quadriceps tendons unremarkable. Hamstring and quadriceps strength is normal.  After informed written and verbal consent, patient was seated on exam table. Right knee was prepped with alcohol swab and utilizing anterolateral approach, patient's right knee space was injected with 4:1  marcaine 0.5%: And pre-centrifuge PRP. Patient tolerated the procedure well without immediate complications.     Impression and Recommendations:     This case required medical decision making of moderate complexity. The above documentation has been reviewed and is accurate and complete Jenny SaaZachary M [name], DO       Note: This dictation was prepared with Dragon dictation along with smaller phrase technology. Any transcriptional errors that result from this process are unintentional.

## 2019-02-04 NOTE — Assessment & Plan Note (Signed)
PRP injections given.  Tolerated the procedure well.  Discussed icing regimen and home exercise, which activities to do which wants to avoid.  Increase activity as tolerated.  Follow-up again in 4 to 8 weeks

## 2019-02-05 ENCOUNTER — Other Ambulatory Visit: Payer: Self-pay

## 2019-02-05 ENCOUNTER — Encounter: Payer: Self-pay | Admitting: Family Medicine

## 2019-02-05 ENCOUNTER — Ambulatory Visit (INDEPENDENT_AMBULATORY_CARE_PROVIDER_SITE_OTHER): Payer: Self-pay | Admitting: Family Medicine

## 2019-02-05 DIAGNOSIS — M170 Bilateral primary osteoarthritis of knee: Secondary | ICD-10-CM

## 2019-02-05 NOTE — Patient Instructions (Signed)
PRP for right knee today Iron 65 mg with 500mg  of Vitamin C daily See me again in 3-4 weeks

## 2019-03-05 NOTE — Progress Notes (Signed)
[name] [address] [place], [place] [address] Phone: [phone] Subjective:   [name], am serving as a scribe for Dr. Antoine Primas.  I'm seeing this patient by the request  of:    CC: Knee pain follow-up  IFO:[id number]  Jenny [name] is a 69 y.o. female coming in with complaint of bilateral knee pain. Patient had PRP last visit in her right knee. Patient has had improvement in pain. Pain with bending at lower back and with squatting.  Patient with states that overall he is feeling about 70 to 80% better.  Feels like this is the least amount of swelling she has had in her knees in quite some time.    Patient did have PRP done on the right knee.  No past medical history on file. Past Surgical History:  Procedure Laterality Date  . CHOLECYSTECTOMY    . TONSILLECTOMY     Social History   Socioeconomic History  . Marital status: Divorced    Spouse name: Not on file  . Number of children: Not on file  . Years of education: Not on file  . Highest education level: Not on file  Occupational History  . Not on file  Social Needs  . Financial resource strain: Not on file  . Food insecurity    Worry: Not on file    Inability: Not on file  . Transportation needs    Medical: Not on file    Non-medical: Not on file  Tobacco Use  . Smoking status: Never Smoker  . Smokeless tobacco: Never Used  Substance and Sexual Activity  . Alcohol use: No  . Drug use: No  . Sexual activity: Not on file  Lifestyle  . Physical activity    Days per week: Not on file    Minutes per session: Not on file  . Stress: Not on file  Relationships  . Social Musician on phone: Not on file    Gets together: Not on file    Attends religious service: Not on file    Active member of club or organization: Not on file    Attends meetings of clubs or organizations: Not on file    Relationship status: Not on file  Other Topics Concern  . Not on  file  Social History Narrative  . Not on file   Allergies  Allergen Reactions  . Demerol Itching  . Meperidine Itching   No family history on file.  Current Outpatient Medications (Endocrine & Metabolic):  .  levothyroxine (SYNTHROID, LEVOTHROID) 50 MCG tablet, Take by mouth.  Current Outpatient Medications (Cardiovascular):  .  furosemide (LASIX) 20 MG tablet, TAKE 1 TABLET BY MOUTH EVERY DAY .  valsartan (DIOVAN) 80 MG tablet, Take 80 mg by mouth.     Current Outpatient Medications (Other):  Marland Kitchen  Diclofenac Sodium (PENNSAID) 2 % SOLN, Place 2 application onto the skin 2 (two) times daily. .  DULoxetine (CYMBALTA) 60 MG capsule, Take 60 mg by mouth daily.   .  temazepam (RESTORIL) 15 MG capsule, Take 15 mg by mouth.    Past medical history, social, surgical and family history all reviewed in electronic medical record.  No pertanent information unless stated regarding to the chief complaint.   Review of Systems:  No headache, visual changes, nausea, vomiting, diarrhea, constipation, dizziness, abdominal pain, skin rash, fevers, chills, night sweats, weight loss, swollen lymph nodes, body aches, joint swelling,, chest pain, shortness of  breath, mood changes.  Mild positive muscle aches  Objective  Blood pressure 104/62, pulse 75, height 5' 2.5" (1.588 m).    General: No apparent distress alert and oriented x3 mood and affect normal, dressed appropriately.  HEENT: Pupils equal, extraocular movements intact  Respiratory: Patient's speak in full sentences and does not appear short of breath  Cardiovascular: No lower extremity edema, non tender, no erythema  Skin: Warm dry intact with no signs of infection or rash on extremities or on axial skeleton.  Abdomen: Soft nontender  Neuro: Cranial nerves II through XII are intact, neurovascularly intact in all extremities with 2+ DTRs and 2+ pulses.  Lymph: No lymphadenopathy of posterior or anterior cervical chain or axillae  bilaterally.  Gait antalgic.  MSK:  Non tender with full range of motion and good stability and symmetric strength and tone of shoulders, elbows, wrist, hip and ankles bilaterally.  Knee: Bilateral valgus deformity noted. Large thigh to calf ratio.  Minimal tenderness over the medial compartment of the right knee only ROM full in flexion and extension and lower leg rotation. instability with valgus force.  painful patellar compression. Patellar glide with moderate crepitus. Patellar and quadriceps tendons unremarkable. Hamstring and quadriceps strength is normal.     Impression and Recommendations:     This case required medical decision making of moderate complexity. The above documentation has been reviewed and is accurate and complete Lyndal Pulley, DO       Note: This dictation was prepared with Dragon dictation along with smaller phrase technology. Any transcriptional errors that result from this process are unintentional.

## 2019-03-07 ENCOUNTER — Ambulatory Visit (INDEPENDENT_AMBULATORY_CARE_PROVIDER_SITE_OTHER): Payer: Medicare Other | Admitting: Family Medicine

## 2019-03-07 ENCOUNTER — Encounter: Payer: Self-pay | Admitting: Family Medicine

## 2019-03-07 ENCOUNTER — Other Ambulatory Visit: Payer: Self-pay

## 2019-03-07 DIAGNOSIS — M170 Bilateral primary osteoarthritis of knee: Secondary | ICD-10-CM

## 2019-03-07 NOTE — Patient Instructions (Signed)
See us when you need us ?

## 2019-03-07 NOTE — Assessment & Plan Note (Signed)
Patient is now nearly 1 month out from [facility] and is feeling 70% better.  Decrease amount of inflammation in the knees is noted today.  Patient has been able to increase activity slowly.  Patient will continue with conservative therapy at this point.  Discussed because this is a new treatment option we do not know if repeat injections are necessary at 6 to 8 weeks but will consider.  Otherwise patient can follow-up with me again as needed

## 2019-04-27 ENCOUNTER — Ambulatory Visit: Payer: Medicare Other | Admitting: Family Medicine

## 2019-04-27 ENCOUNTER — Encounter: Payer: Self-pay | Admitting: Family Medicine

## 2019-04-27 DIAGNOSIS — M170 Bilateral primary osteoarthritis of knee: Secondary | ICD-10-CM | POA: Diagnosis not present

## 2019-04-27 NOTE — Patient Instructions (Signed)
Injected both knees today See me again in 4-5 weeks      [address]st [address] [place], [place] [address] Phone [phone]

## 2019-04-27 NOTE — Assessment & Plan Note (Signed)
Repeat injections given today.  Discussed icing regimen and home exercise, discussed which activities to do which wants to avoid.  Patient will increase in 4 to 6 weeks.

## 2019-04-27 NOTE — Progress Notes (Signed)
[name] [address] [place], [place] [address] Phone: [phone] Subjective:   Jenny [name], am serving as a scribe for Dr. Antoine Primas.  This visit occurred during the SARS-CoV-2 public health emergency.  Safety protocols were in place, including screening questions prior to the visit, additional usage of staff PPE, and extensive cleaning of exam room while observing appropriate contact time as indicated for disinfecting solutions.    CC: Bilateral knee pain  OHY:[id number]   03/07/2019 Patient is now nearly 1 month out from [facility] and is feeling 70% better.  Decrease amount of inflammation in the knees is noted today.  Patient has been able to increase activity slowly.  Patient will continue with conservative therapy at this point.  Discussed because this is a new treatment option we do not know if repeat injections are necessary at 6 to 8 weeks but will consider.  Otherwise patient can follow-up with me again as needed  Update 04/27/2019 Jenny [name] is a 68 y.o. female coming in with complaint of knee pain. Patient states that her left knee is bothering her more than the right. PRP was given in right knee. Is considering PRP for left knee after the Holidays. Has been on her feet a lot moving things out of her mom's house. Has good and bad days where the intensity can be greater or lesser. Pain occurring on medial aspect of knee.    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 Past Surgical History:  Procedure Laterality Date  . CHOLECYSTECTOMY    . TONSILLECTOMY     Social History   Socioeconomic History  . Marital status: Divorced    Spouse name: Not on file  . Number of children: Not on file  . Years of education: Not on file  . Highest education level: Not on file  Occupational History  . Not on file  Social Needs  . Financial resource strain: Not on file  . Food insecurity    Worry: Not  on file    Inability: Not on file  . Transportation needs    Medical: Not on file    Non-medical: Not on file  Tobacco Use  . Smoking status: Never Smoker  . Smokeless tobacco: Never Used  Substance and Sexual Activity  . Alcohol use: No  . Drug use: No  . Sexual activity: Not on file  Lifestyle  . Physical activity    Days per week: Not on file    Minutes per session: Not on file  . Stress: Not on file  Relationships  . Social Musician on phone: Not on file    Gets together: Not on file    Attends religious service: Not on file    Active member of club or organization: Not on file    Attends meetings of clubs or organizations: Not on file    Relationship status: Not on file  Other Topics Concern  . Not on file  Social History Narrative  . Not on file   Allergies  Allergen Reactions  . Demerol Itching  . Meperidine Itching   No family history on file.  Current Outpatient Medications (Endocrine & Metabolic):  .  levothyroxine (SYNTHROID, LEVOTHROID) 50 MCG tablet, Take by mouth.  Current Outpatient Medications (Cardiovascular):  .  valsartan (DIOVAN) 80 MG tablet, Take 80 mg by mouth.     Current Outpatient Medications (Other):  .  Diclofenac Sodium (PENNSAID) 2 % SOLN, Place 2 application onto the skin 2 (two) times daily. .  DULoxetine (CYMBALTA) 60 MG capsule, Take 60 mg by mouth daily.   .  temazepam (RESTORIL) 15 MG capsule, Take 15 mg by mouth.    Past medical history, social, surgical and family history all reviewed in electronic medical record.  No pertanent information unless stated regarding to the chief complaint.   Review of Systems:  No headache, visual changes, nausea, vomiting, diarrhea, constipation, dizziness, abdominal pain, skin rash, fevers, chills, night sweats, weight loss, swollen lymph nodes, body aches, joint swelling,  chest pain, shortness of breath, mood changes. Positive muscle aches  Objective  Blood pressure 106/62,  pulse 90, height 5' 2.5" (1.588 m), weight 280 lb (127 kg), SpO2 98 %.    General: No apparent distress alert and oriented x3 mood and affect normal, dressed appropriately.  HEENT: Pupils equal, extraocular movements intact  Respiratory: Patient's speak in full sentences and does not appear short of breath  Cardiovascular: Trace lower extremity edema, tender, no erythema  Skin: Warm dry intact with no signs of infection or rash on extremities or on axial skeleton.  Abdomen: Soft nontender  Neuro: Cranial nerves II through XII are intact, neurovascularly intact in all extremities with 2+ DTRs and 2+ pulses.  Lymph: No lymphadenopathy of posterior or anterior cervical chain or axillae bilaterally.  Gait severely antalgic MSK:  tender with full range of motion and good stability and symmetric strength and tone of shoulders, elbows, wrist, hip, and ankles bilaterally.   Knee: Bilateral valgus deformity noted. Large thigh to calf ratio.  Tender to palpation over medial and PF joint line.  ROM full in flexion and extension and lower leg rotation. instability with valgus force.  painful patellar compression. Patellar glide with moderate crepitus. Patellar and quadriceps tendons unremarkable. Hamstring and quadriceps strength is normal.   After informed written and verbal consent, patient was seated on exam table. Right knee was prepped with alcohol swab and utilizing anterolateral approach, patient's right knee space was injected with 4:1  marcaine 0.5%: Kenalog 40mg /dL. Patient tolerated the procedure well without immediate complications.  After informed written and verbal consent, patient was seated on exam table. Left knee was prepped with alcohol swab and utilizing anterolateral approach, patient's left knee space was injected with 4:1  marcaine 0.5%: Kenalog 40mg /dL. Patient tolerated the procedure well without immediate complications.    Impression and Recommendations:     This case  required medical decision making of moderate complexity. The above documentation has been reviewed and is accurate and complete Jenny Pulley, DO       Note: This dictation was prepared with Dragon dictation along with smaller phrase technology. Any transcriptional errors that result from this process are unintentional.

## 2019-06-06 ENCOUNTER — Ambulatory Visit: Payer: Medicare Other | Admitting: Family Medicine

## 2019-06-26 ENCOUNTER — Encounter: Payer: Self-pay | Admitting: Family Medicine

## 2019-06-26 ENCOUNTER — Ambulatory Visit (INDEPENDENT_AMBULATORY_CARE_PROVIDER_SITE_OTHER): Payer: Self-pay | Admitting: Family Medicine

## 2019-06-26 ENCOUNTER — Other Ambulatory Visit: Payer: Self-pay

## 2019-06-26 DIAGNOSIS — M170 Bilateral primary osteoarthritis of knee: Secondary | ICD-10-CM

## 2019-06-26 NOTE — Progress Notes (Signed)
[name] [address] [place] [address] Phone: [phone] Subjective:   Jenny [name], am serving as a scribe for Dr. Antoine Primas. This visit occurred during the SARS-CoV-2 public health emergency.  Safety protocols were in place, including screening questions prior to the visit, additional usage of staff PPE, and extensive cleaning of exam room while observing appropriate contact time as indicated for disinfecting solutions.   I'm seeing this patient by the request  of:  [name]., MD  CC: Bilateral knee pain left greater than right  [id number]   04/27/2019 Repeat injections given today.  Discussed icing regimen and home exercise, discussed which activities to do which wants to avoid.  Patient will increase in 4 to 6 weeks.  Update 06/26/2019 Jenny [name] is a 69 y.o. female coming in with complaint of bilateral knee pain. Patient states that she has been having episodes where her knee "gives away." L>R. Pain is intermittent and located laterally.  Patient did have PRP in the right knee back in September and doing relatively well.  Patient is here for the contralateral side       No past medical history on file. Past Surgical History:  Procedure Laterality Date  . CHOLECYSTECTOMY    . TONSILLECTOMY     Social History   Socioeconomic History  . Marital status: Divorced    Spouse name: Not on file  . Number of children: Not on file  . Years of education: Not on file  . Highest education level: Not on file  Occupational History  . Not on file  Tobacco Use  . Smoking status: Never Smoker  . Smokeless tobacco: Never Used  Substance and Sexual Activity  . Alcohol use: No  . Drug use: No  . Sexual activity: Not on file  Other Topics Concern  . Not on file  Social History Narrative  . Not on file   Social Determinants of Health   Financial Resource Strain:   . Difficulty of Paying Living Expenses: Not on  file  Food Insecurity:   . Worried About Programme researcher, broadcasting/film/video in the Last Year: Not on file  . Ran Out of Food in the Last Year: Not on file  Transportation Needs:   . Lack of Transportation (Medical): Not on file  . Lack of Transportation (Non-Medical): Not on file  Physical Activity:   . Days of Exercise per Week: Not on file  . Minutes of Exercise per Session: Not on file  Stress:   . Feeling of Stress : Not on file  Social Connections:   . Frequency of Communication with Friends and Family: Not on file  . Frequency of Social Gatherings with Friends and Family: Not on file  . Attends Religious Services: Not on file  . Active Member of Clubs or Organizations: Not on file  . Attends Banker Meetings: Not on file  . Marital Status: Not on file   Allergies  Allergen Reactions  . Demerol Itching  . Meperidine Itching   No family history on file.  Current Outpatient Medications (Endocrine & Metabolic):  .  levothyroxine (SYNTHROID, LEVOTHROID) 50 MCG tablet, Take by mouth.  Current Outpatient Medications (Cardiovascular):  .  valsartan (DIOVAN) 80 MG tablet, Take 80 mg by mouth.     Current Outpatient Medications (Other):  Marland Kitchen  Diclofenac Sodium (PENNSAID) 2 % SOLN, Place 2 application onto the skin 2 (two) times daily. .  DULoxetine (CYMBALTA) 60 MG  capsule, Take 60 mg by mouth daily.   .  temazepam (RESTORIL) 15 MG capsule, Take 15 mg by mouth.   Reviewed prior external information including notes and imaging from  primary care provider As well as notes that were available from care everywhere and other healthcare systems.  Past medical history, social, surgical and family history all reviewed in electronic medical record.  No pertanent information unless stated regarding to the chief complaint.   Review of Systems:  No headache, visual changes, nausea, vomiting, diarrhea, constipation, dizziness, abdominal pain, skin rash, fevers, chills, night sweats,  weight loss, swollen lymph nodes, body aches, joint swelling, chest pain, shortness of breath, mood changes. POSITIVE muscle aches  Objective  Blood pressure 102/78, pulse 74, height 5' 2.5" (1.588 m), weight 275 lb (124.7 kg), SpO2 98 %.   General: No apparent distress alert and oriented x3 mood and affect normal, dressed appropriately.   After informed written and verbal consent, patient was seated on exam table. Left knee was prepped with alcohol swab and utilizing anterolateral approach, patient's left knee space was injected with 1 cc of 0.5% Marcaine and injected with 5 cc of precentrifuge.  Pain L. Patient tolerated the procedure well without immediate complications.    Impression and Recommendations:     This case required medical decision making of moderate complexity. The above documentation has been reviewed and is accurate and complete Lyndal Pulley, DO       Note: This dictation was prepared with Dragon dictation along with smaller phrase technology. Any transcriptional errors that result from this process are unintentional.

## 2019-06-26 NOTE — Assessment & Plan Note (Signed)
PRP given on the left side.  Discussed icing regimen and home exercise, discussed which activities to do which wants to avoid.  Patient is going to increase activity as tolerated.  Patient has had this done on her contralateral side with some good results.  Follow-up again in 10 weeks

## 2019-06-26 NOTE — Patient Instructions (Signed)
PRP today No ice or IBU for the next 3 days See me again in 3 weeks

## 2019-06-27 ENCOUNTER — Encounter: Payer: Self-pay | Admitting: Family Medicine

## 2019-07-24 ENCOUNTER — Ambulatory Visit: Payer: Medicare Other | Admitting: Family Medicine

## 2019-07-24 NOTE — Progress Notes (Deleted)
[name] [address] [place] [address] Phone: [phone] Subjective:    I'm seeing this patient by the request  of:  [name]., MD  CC: Bilateral knee pain follow-up  [id number]  Jenny [name] is a 69 y.o. female coming in with complaint of bilateral knee pain. Last seen on 06/26/2019 for PRP in left knee. Patient states     PRP left knee given June 26, 2019  No past medical history on file. Past Surgical History:  Procedure Laterality Date  . CHOLECYSTECTOMY    . TONSILLECTOMY     Social History   Socioeconomic History  . Marital status: Divorced    Spouse name: Not on file  . Number of children: Not on file  . Years of education: Not on file  . Highest education level: Not on file  Occupational History  . Not on file  Tobacco Use  . Smoking status: Never Smoker  . Smokeless tobacco: Never Used  Substance and Sexual Activity  . Alcohol use: No  . Drug use: No  . Sexual activity: Not on file  Other Topics Concern  . Not on file  Social History Narrative  . Not on file   Social Determinants of Health   Financial Resource Strain:   . Difficulty of Paying Living Expenses: Not on file  Food Insecurity:   . Worried About Programme researcher, broadcasting/film/video in the Last Year: Not on file  . Ran Out of Food in the Last Year: Not on file  Transportation Needs:   . Lack of Transportation (Medical): Not on file  . Lack of Transportation (Non-Medical): Not on file  Physical Activity:   . Days of Exercise per Week: Not on file  . Minutes of Exercise per Session: Not on file  Stress:   . Feeling of Stress : Not on file  Social Connections:   . Frequency of Communication with Friends and Family: Not on file  . Frequency of Social Gatherings with Friends and Family: Not on file  . Attends Religious Services: Not on file  . Active Member of Clubs or Organizations: Not on file  . Attends Banker Meetings: Not on  file  . Marital Status: Not on file   Allergies  Allergen Reactions  . Demerol Itching  . Meperidine Itching   No family history on file.  Current Outpatient Medications (Endocrine & Metabolic):  .  levothyroxine (SYNTHROID, LEVOTHROID) 50 MCG tablet, Take by mouth.  Current Outpatient Medications (Cardiovascular):  .  valsartan (DIOVAN) 80 MG tablet, Take 80 mg by mouth.     Current Outpatient Medications (Other):  Marland Kitchen  Diclofenac Sodium (PENNSAID) 2 % SOLN, Place 2 application onto the skin 2 (two) times daily. .  DULoxetine (CYMBALTA) 60 MG capsule, Take 60 mg by mouth daily.   .  temazepam (RESTORIL) 15 MG capsule, Take 15 mg by mouth.   Reviewed prior external information including notes and imaging from  primary care provider As well as notes that were available from care everywhere and other healthcare systems.  Past medical history, social, surgical and family history all reviewed in electronic medical record.  No pertanent information unless stated regarding to the chief complaint.   Review of Systems:  No headache, visual changes, nausea, vomiting, diarrhea, constipation, dizziness, abdominal pain, skin rash, fevers, chills, night sweats, weight loss, swollen lymph nodes, body aches, joint swelling, chest pain, shortness of breath, mood changes. POSITIVE muscle aches  Objective  There were no vitals taken for this visit.   General: No apparent distress alert and oriented x3 mood and affect normal, dressed appropriately.  HEENT: Pupils equal, extraocular movements intact  Respiratory: Patient's speak in full sentences and does not appear short of breath  Cardiovascular: No lower extremity edema, non tender, no erythema  Skin: Warm dry intact with no signs of infection or rash on extremities or on axial skeleton.  Abdomen: Soft nontender  Neuro: Cranial nerves II through XII are intact, neurovascularly intact in all extremities with 2+ DTRs and 2+ pulses.  Lymph: No  lymphadenopathy of posterior or anterior cervical chain or axillae bilaterally.  Gait antalgic MSK:  Non tender with full range of motion and good stability and symmetric strength and tone of shoulders, elbows, wrist, hip and ankles bilaterally.  Knee: Bilateral valgus deformity noted. Large thigh to calf ratio.  Tender to palpation over medial and PF joint line.  ROM full in flexion and extension and lower leg rotation. instability with valgus force.  painful patellar compression. Patellar glide with moderate crepitus. Patellar and quadriceps tendons unremarkable. Hamstring and quadriceps strength is normal. Contralateral knee shows   Impression and Recommendations:     This case required medical decision making of moderate complexity. The above documentation has been reviewed and is accurate and complete Lyndal Pulley, DO       Note: This dictation was prepared with Dragon dictation along with smaller phrase technology. Any transcriptional errors that result from this process are unintentional.

## 2019-07-27 ENCOUNTER — Other Ambulatory Visit: Payer: Self-pay

## 2019-07-27 ENCOUNTER — Encounter: Payer: Self-pay | Admitting: Family Medicine

## 2019-07-27 ENCOUNTER — Ambulatory Visit (INDEPENDENT_AMBULATORY_CARE_PROVIDER_SITE_OTHER): Payer: Medicare Other | Admitting: Family Medicine

## 2019-07-27 DIAGNOSIS — M170 Bilateral primary osteoarthritis of knee: Secondary | ICD-10-CM | POA: Diagnosis not present

## 2019-07-27 NOTE — Patient Instructions (Signed)
Glad you are doing better Call me if you want a sleep study See me when you need me Can always repeat PRP

## 2019-07-27 NOTE — Assessment & Plan Note (Signed)
Doing better after the PRP. Discussed the possibility of repeating again. Patient though has made improvement for the first time in quite some time with findings. Patient is in agreement with the plan. Follow-up with me again as needed

## 2019-07-27 NOTE — Progress Notes (Signed)
[name] [address] [place] [address] Phone: [phone] Subjective:   [name], am serving as a scribe for Dr. Antoine Primas. This visit occurred during the SARS-CoV-2 public health emergency.  Safety protocols were in place, including screening questions prior to the visit, additional usage of staff PPE, and extensive cleaning of exam room while observing appropriate contact time as indicated for disinfecting solutions.    I'm seeing this patient by the request  of:  [name]., MD  CC: Bilateral knee pain  [id number]  Jenny [name] is a 69 y.o. female coming in with complaint of bilateral knee pain. PRP injection given on left side 06/26/2019. Patient states that she is doing much better. Is unable to climb up step ladder without getting some soreness. Has not any issues with instability. Had cramps last night her leg muscles.  Other than that has been feeling relatively good.     No past medical history on file. Past Surgical History:  Procedure Laterality Date  . CHOLECYSTECTOMY    . TONSILLECTOMY     Social History   Socioeconomic History  . Marital status: Divorced    Spouse name: Not on file  . Number of children: Not on file  . Years of education: Not on file  . Highest education level: Not on file  Occupational History  . Not on file  Tobacco Use  . Smoking status: Never Smoker  . Smokeless tobacco: Never Used  Substance and Sexual Activity  . Alcohol use: No  . Drug use: No  . Sexual activity: Not on file  Other Topics Concern  . Not on file  Social History Narrative  . Not on file   Social Determinants of Health   Financial Resource Strain:   . Difficulty of Paying Living Expenses: Not on file  Food Insecurity:   . Worried About Programme researcher, broadcasting/film/video in the Last Year: Not on file  . Ran Out of Food in the Last Year: Not on file  Transportation Needs:   . Lack of Transportation (Medical):  Not on file  . Lack of Transportation (Non-Medical): Not on file  Physical Activity:   . Days of Exercise per Week: Not on file  . Minutes of Exercise per Session: Not on file  Stress:   . Feeling of Stress : Not on file  Social Connections:   . Frequency of Communication with Friends and Family: Not on file  . Frequency of Social Gatherings with Friends and Family: Not on file  . Attends Religious Services: Not on file  . Active Member of Clubs or Organizations: Not on file  . Attends Banker Meetings: Not on file  . Marital Status: Not on file   Allergies  Allergen Reactions  . Demerol Itching  . Meperidine Itching   No family history on file.  Current Outpatient Medications (Endocrine & Metabolic):  .  levothyroxine (SYNTHROID, LEVOTHROID) 50 MCG tablet, Take by mouth.  Current Outpatient Medications (Cardiovascular):  .  valsartan (DIOVAN) 80 MG tablet, Take 80 mg by mouth.     Current Outpatient Medications (Other):  Marland Kitchen  Diclofenac Sodium (PENNSAID) 2 % SOLN, Place 2 application onto the skin 2 (two) times daily. .  DULoxetine (CYMBALTA) 60 MG capsule, Take 60 mg by mouth daily.   .  temazepam (RESTORIL) 15 MG capsule, Take 15 mg by mouth.   Reviewed prior external information including notes and imaging from  primary care  provider As well as notes that were available from care everywhere and other healthcare systems.  Past medical history, social, surgical and family history all reviewed in electronic medical record.  No pertanent information unless stated regarding to the chief complaint.   Review of Systems:  No headache, visual changes, nausea, vomiting, diarrhea, constipation, dizziness, abdominal pain, skin rash, fevers, chills, night sweats, weight loss, swollen lymph nodes, body aches, joint swelling, chest pain, shortness of breath, mood changes. POSITIVE muscle aches  Objective  Blood pressure 122/74, pulse 80, height 5' 2.5" (1.588 m), weight  270 lb (122.5 kg), SpO2 97 %.   General: No apparent distress alert and oriented x3 mood and affect normal, dressed appropriately.  HEENT: Pupils equal, extraocular movements intact  Respiratory: Patient's speak in full sentences and does not appear short of breath  Cardiovascular: Trace lower extremity edema, non tender, no erythema  Skin: Warm dry intact with no signs of infection or rash on extremities or on axial skeleton.  Abdomen: Soft nontender  Neuro: Cranial nerves II through XII are intact, neurovascularly intact in all extremities with 2+ DTRs and 2+ pulses.  Lymph: No lymphadenopathy of posterior or anterior cervical chain or axillae bilaterally.  Gait still antalgic MSK:   Knee: Bilateral valgus deformity noted. Large thigh to calf ratio.  Tender to palpation over medial  joint line. Significant less tenderness than the previous exam ROM full in flexion and extension and lower leg rotation. Patellar glide with mild crepitus. Patellar and quadriceps tendons unremarkable. Hamstring and quadriceps strength is normal. C    Impression and Recommendations:     The above documentation has been reviewed and is accurate and complete Lyndal Pulley, DO       Note: This dictation was prepared with Dragon dictation along with smaller phrase technology. Any transcriptional errors that result from this process are unintentional.

## 2019-10-01 ENCOUNTER — Telehealth: Payer: Self-pay | Admitting: Family Medicine

## 2019-10-01 NOTE — Telephone Encounter (Signed)
Pt stated that Dr. Katrinka Blazing would refer her for a Sleep Study, she is ready to have this done and would like [place] to start the referral process.

## 2019-10-02 ENCOUNTER — Other Ambulatory Visit: Payer: Self-pay

## 2019-10-02 DIAGNOSIS — R0683 Snoring: Secondary | ICD-10-CM

## 2019-10-02 DIAGNOSIS — G4730 Sleep apnea, unspecified: Secondary | ICD-10-CM

## 2019-10-02 NOTE — Telephone Encounter (Signed)
Spoke with patient in regards to referral.

## 2019-10-18 ENCOUNTER — Other Ambulatory Visit: Payer: Self-pay

## 2019-11-09 DIAGNOSIS — N1831 Chronic kidney disease, stage 3a: Secondary | ICD-10-CM | POA: Insufficient documentation

## 2021-02-24 ENCOUNTER — Other Ambulatory Visit: Payer: Self-pay

## 2021-02-24 ENCOUNTER — Encounter: Payer: Self-pay | Admitting: Family Medicine

## 2021-02-24 ENCOUNTER — Ambulatory Visit: Payer: Medicare Other | Admitting: Family Medicine

## 2021-02-24 DIAGNOSIS — M170 Bilateral primary osteoarthritis of knee: Secondary | ICD-10-CM | POA: Diagnosis not present

## 2021-02-24 NOTE — Progress Notes (Signed)
[name] [address] [place] [address] Phone: [phone] Subjective:   Jenny [name], am serving as a scribe for Dr. Antoine Primas. This visit occurred during the SARS-CoV-2 public health emergency.  Safety protocols were in place, including screening questions prior to the visit, additional usage of staff PPE, and extensive cleaning of exam room while observing appropriate contact time as indicated for disinfecting solutions.   I'm seeing this patient by the request  of:  [name]., MD  CC: Bilateral knee pain  [id number]  2021 Doing better after the PRP. Discussed the possibility of repeating again. Patient though has made improvement for the first time in quite some time with findings. Patient is in agreement with the plan. Follow-up with me again as needed  Updated 02/24/2021 Jenny [name] is a 70 y.o. female coming in with complaint of bilateral knee pain.  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  Has been nearly 2 years since patient did have PRP.  Feels like it was very beneficial but the last month started to have some increasing discomfort again.  Affecting daily activities and is uncomfortable at night..      No past medical history on file. Past Surgical History:  Procedure Laterality Date   CHOLECYSTECTOMY     TONSILLECTOMY     Social History   Socioeconomic History   Marital status: Divorced    Spouse name: Not on file   Number of children: Not on file   Years of education: Not on file   Highest education level: Not on file  Occupational History   Not on file  Tobacco Use   Smoking status: Never   Smokeless tobacco: Never  Substance and Sexual Activity   Alcohol use: No   Drug use: No   Sexual activity: Not on file  Other Topics Concern   Not on file  Social History Narrative   Not on file   Social Determinants of Health    Financial Resource Strain: Not on file  Food Insecurity: Not on file  Transportation Needs: Not on file  Physical Activity: Not on file  Stress: Not on file  Social Connections: Not on file   Allergies  Allergen Reactions   Demerol Itching   Meperidine Itching   Wound Dressing Adhesive    No family history on file.  Current Outpatient Medications (Endocrine & Metabolic):    levothyroxine (SYNTHROID, LEVOTHROID) 50 MCG tablet, Take by mouth.  Current Outpatient Medications (Cardiovascular):    valsartan (DIOVAN) 80 MG tablet, Take 80 mg by mouth.     Current Outpatient Medications (Other):    Diclofenac Sodium (PENNSAID) 2 % SOLN, Place 2 application onto the skin 2 (two) times daily.   DULoxetine (CYMBALTA) 60 MG capsule, Take 60 mg by mouth daily.     temazepam (RESTORIL) 15 MG capsule, Take 15 mg by mouth.   Reviewed prior external information including notes and imaging from  primary care provider As well as notes that were available from care everywhere and other healthcare systems.  Past medical history, social, surgical and family history all reviewed in electronic medical record.  No pertanent information unless stated regarding to the chief complaint.   Review of Systems:  No headache, visual changes, nausea, vomiting, diarrhea, constipation, dizziness, abdominal pain, skin rash, fevers, chills, night sweats, weight loss, swollen lymph nodes, body aches, joint swelling, chest pain, shortness of breath,  mood changes. POSITIVE muscle aches  Objective  Blood pressure (!) 144/86, pulse 68, height 5\' 2"  (1.575 m), weight 275 lb (124.7 kg), SpO2 99 %.   General: No apparent distress alert and oriented x3 mood and affect normal, dressed appropriately.  HEENT: Pupils equal, extraocular movements intact  Respiratory: Patient's speak in full sentences and does not appear short of breath  Cardiovascular: No lower extremity edema, non tender, no erythema  Gait  antalgic Knee: Bilateral valgus deformity noted. Large thigh to calf ratio.  Difficult to assess secondary to body habitus Tender to palpation over medial and PF joint line.  ROM f limited lacking last 5 degrees of extension 10 degrees of flexion instability with valgus force.  painful patellar compression. Patellar glide with moderate crepitus. Patellar and quadriceps tendons unremarkable. Hamstring and quadriceps strength is normal.  After informed written and verbal consent, patient was seated on exam table. Right knee was prepped with alcohol swab and utilizing anterolateral approach, patient's right knee space was injected with 4:1  marcaine 0.5%: Kenalog 40mg /dL. Patient tolerated the procedure well without immediate complications.  After informed written and verbal consent, patient was seated on exam table. Left knee was prepped with alcohol swab and utilizing anterolateral approach, patient's left knee space was injected with 4:1  marcaine 0.5%: Kenalog 40mg /dL. Patient tolerated the procedure well without immediate complications.   Impression and Recommendations:     The above documentation has been reviewed and is accurate and complete , DO

## 2021-02-24 NOTE — Assessment & Plan Note (Signed)
Repeat troponin today.  Patient did do very well with the PRP injections.  Patient would like to consider that again in the next 4 weeks.  We will continue to reduce.  Patient will be set up for this.  Discussed icing regimen and home exercises.  Discussed continuing the other medications including the topical anti-inflammatories in the duloxetine for his chronic problem with exacerbation.  Patient will follow up with me again in 4-6 weeks

## 2021-02-24 NOTE — Patient Instructions (Addendum)
Injection today Good to see you! Have an appointment in 4 weeks for PRP

## 2021-04-01 NOTE — Progress Notes (Signed)
[name] [address] [place] [address] Phone: [phone] Subjective:   Jenny [name], am serving as a scribe for Dr. Antoine [name].  This visit occurred during the SARS-CoV-2 public health emergency.  Safety protocols were in place, including screening questions prior to the visit, additional usage of staff PPE, and extensive cleaning of exam room while observing appropriate contact time as indicated for disinfecting solutions.   I'm seeing this patient by the request  of:  Jenny [name]., MD  CC: Bilateral knee pain  XKG:[id number]  02/24/2021 Repeat troponin today.  Patient did do very well with the PRP injections.  Patient would like to consider that again in the next 4 weeks.  We will continue to reduce.  Patient will be set up for this.  Discussed icing regimen and home exercises.  Discussed continuing the other medications including the topical anti-inflammatories in the duloxetine for his chronic problem with exacerbation.  Patient will follow up with me again in 4-6 weeks  Update 04/02/2021 Jenny [name] is a 70 y.o. female coming in with complaint of B knee pain. Patient is here for PRP today. Patient states he continues to have severe amount of pain.  Patient has responded well to PRP previously.       No past medical history on file. Past Surgical History:  Procedure Laterality Date   CHOLECYSTECTOMY     TONSILLECTOMY     Social History   Socioeconomic History   Marital status: Divorced    Spouse name: Not on file   Number of children: Not on file   Years of education: Not on file   Highest education level: Not on file  Occupational History   Not on file  Tobacco Use   Smoking status: Never   Smokeless tobacco: Never  Substance and Sexual Activity   Alcohol use: No   Drug use: No   Sexual activity: Not on file  Other Topics Concern   Not on file  Social History Narrative   Not on file   Social  Determinants of Health   Financial Resource Strain: Not on file  Food Insecurity: Not on file  Transportation Needs: Not on file  Physical Activity: Not on file  Stress: Not on file  Social Connections: Not on file   Allergies  Allergen Reactions   Demerol Itching   Meperidine Itching   Wound Dressing Adhesive    No family history on file.  Current Outpatient Medications (Endocrine & Metabolic):    levothyroxine (SYNTHROID, LEVOTHROID) 50 MCG tablet, Take by mouth.  Current Outpatient Medications (Cardiovascular):    valsartan (DIOVAN) 80 MG tablet, Take 80 mg by mouth.     Current Outpatient Medications (Other):    Diclofenac Sodium (PENNSAID) 2 % SOLN, Place 2 application onto the skin 2 (two) times daily.   DULoxetine (CYMBALTA) 60 MG capsule, Take 60 mg by mouth daily.     temazepam (RESTORIL) 15 MG capsule, Take 15 mg by mouth.    Objective  Height 5\' 2"  (1.575 m), weight 273 lb (123.8 kg).   General: No apparent distress alert and oriented x3 mood and affect normal, dressed appropriately.  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  After informed written and verbal consent,  patient was seated on exam table. Right knee was prepped with alcohol swab and utilizing anterolateral approach, patient's right knee space was injected with 2 cc of 0.5% Marcaine were then injected with 6 cc of pre centrifuge PRP. Patient tolerated the procedure well without immediate complications.  After informed written and verbal consent, patient was seated on exam table. Left knee was prepped with alcohol swab and utilizing anterolateral approach, patient's left knee space was injected with 2 cc of 0.5% Marcaine with a 21-gauge 2 inch  needle.  Patient then was injected with 6 cc of 3 centrifuge PRP.  Patient tolerated procedure well without any immediate complications.  Band-Aid placed.  Postinjection instructions given    Impression and Recommendations:     The above documentation has been reviewed and is accurate and complete Jenny Saa, DO

## 2021-04-02 ENCOUNTER — Encounter: Payer: Self-pay | Admitting: Family Medicine

## 2021-04-02 ENCOUNTER — Other Ambulatory Visit: Payer: Self-pay

## 2021-04-02 ENCOUNTER — Ambulatory Visit (INDEPENDENT_AMBULATORY_CARE_PROVIDER_SITE_OTHER): Payer: Self-pay | Admitting: Family Medicine

## 2021-04-02 DIAGNOSIS — M170 Bilateral primary osteoarthritis of knee: Secondary | ICD-10-CM

## 2021-04-02 NOTE — Patient Instructions (Signed)
Good to see you  Read the other handout and follow directions After 3 days ok to ice if needed and can ice if needed if worsening  Redness, swelling, any fever please seek medical attention immediately.  See me again in 6-8 weeks

## 2021-04-02 NOTE — Assessment & Plan Note (Signed)
Patient has had the PRP previously and did respond extremely well for over 1 year.  Hopefully patient will have as good of response again.  Discussed with patient about icing regimen and home exercises and the post PRP injection protocol.  Patient will follow up with me again 6 to 8 weeks to make sure patient is responding appropriately.

## 2021-05-19 ENCOUNTER — Ambulatory Visit: Payer: Medicare Other | Admitting: Family Medicine

## 2021-10-23 ENCOUNTER — Ambulatory Visit: Payer: Medicare Other | Admitting: Family Medicine

## 2021-11-05 NOTE — Progress Notes (Signed)
[name] [address] [place] [address] Phone: [phone] Subjective:   Jenny [name], am serving as a scribe for Dr. Antoine Primas.  I'm seeing this patient by the request  of:  [name]., MD  CC: bilateral knee pain   YWV:[id number]  04/02/2021 Patient has had the PRP previously and did respond extremely well for over 1 year.  Hopefully patient will have as good of response again.  Discussed with patient about icing regimen and home exercises and the post PRP injection protocol.  Patient will follow up with me again 6 to 8 weeks to make sure patient is responding appropriately.  Update 11/06/2021 Jenny [name] is a 71 y.o. female coming in with complaint of B knee pain. Patient did suffer a fall recently. Patient states fell and hit right below the patella. Feels like there is something behind the left knee. Possible cyst??? No other complaints       No past medical history on file. Past Surgical History:  Procedure Laterality Date   CHOLECYSTECTOMY     TONSILLECTOMY     Social History   Socioeconomic History   Marital status: Divorced    Spouse name: Not on file   Number of children: Not on file   Years of education: Not on file   Highest education level: Not on file  Occupational History   Not on file  Tobacco Use   Smoking status: Never   Smokeless tobacco: Never  Substance and Sexual Activity   Alcohol use: No   Drug use: No   Sexual activity: Not on file  Other Topics Concern   Not on file  Social History Narrative   Not on file   Social Determinants of Health   Financial Resource Strain: Not on file  Food Insecurity: Not on file  Transportation Needs: Not on file  Physical Activity: Not on file  Stress: Not on file  Social Connections: Not on file   Allergies  Allergen Reactions   Demerol Itching   Meperidine Itching   Wound Dressing Adhesive    No family history on file.  Current  Outpatient Medications (Endocrine & Metabolic):    levothyroxine (SYNTHROID, LEVOTHROID) 50 MCG tablet, Take by mouth.  Current Outpatient Medications (Cardiovascular):    valsartan (DIOVAN) 80 MG tablet, Take 80 mg by mouth.     Current Outpatient Medications (Other):    Diclofenac Sodium (PENNSAID) 2 % SOLN, Place 2 application onto the skin 2 (two) times daily.   DULoxetine (CYMBALTA) 60 MG capsule, Take 60 mg by mouth daily.     temazepam (RESTORIL) 15 MG capsule, Take 15 mg by mouth.    Review of Systems:  No headache, visual changes, nausea, vomiting, diarrhea, constipation, dizziness, abdominal pain, skin rash, fevers, chills, night sweats, weight loss, swollen lymph nodes, body aches, joint swelling, chest pain, shortness of breath, mood changes. POSITIVE muscle aches  Objective  Blood pressure 132/78, pulse 78, height 5\' 2"  (1.575 m), weight 271 lb (122.9 kg).   General: No apparent distress alert and oriented x3 mood and affect normal, dressed appropriately.  HEENT: Pupils equal, extraocular movements intact  Respiratory: Patient's speak in full sentences and does not appear short of breath  Cardiovascular: No lower extremity edema, non tender, no erythema  Bilateral knee exam shows the patient does have effusion noted of the knee joint greater than left.  Patient does have limited range of motion especially with flexion of the knee. Patient  does have instability in the knees bilaterally.  Limited muscular skeletal ultrasound was performed and interpreted by Antoine Primas, M  Limited ultrasound shows the patient does have a mild effusion of the knees bilaterally.  Seems to be right greater than left.  Patient does have what appears to be a hematoma on the anterior aspect of the tibia and the soft tissue.  No cortical irregularity noted at that time.  Patient does have significant narrowing of the medial joint line bilaterally.  After informed written and verbal consent,  patient was seated on exam table. Right knee was prepped with alcohol swab and utilizing anterolateral approach, patient's right knee space was injected with 4:1  marcaine 0.5%: Kenalog 40mg /dL. Patient tolerated the procedure well without immediate complications.  After informed written and verbal consent, patient was seated on exam table. Left knee was prepped with alcohol swab and utilizing anterolateral approach, patient's left knee space was injected with 4:1  marcaine 0.5%: Kenalog 40mg /dL. Patient tolerated the procedure well without immediate complications.    Impression and Recommendations:

## 2021-11-06 ENCOUNTER — Encounter: Payer: Self-pay | Admitting: Family Medicine

## 2021-11-06 ENCOUNTER — Ambulatory Visit: Payer: Self-pay

## 2021-11-06 ENCOUNTER — Ambulatory Visit: Payer: Medicare Other | Admitting: Family Medicine

## 2021-11-06 VITALS — BP 132/78 | HR 78 | Ht 62.0 in | Wt 271.0 lb

## 2021-11-06 DIAGNOSIS — M170 Bilateral primary osteoarthritis of knee: Secondary | ICD-10-CM

## 2021-11-06 NOTE — Assessment & Plan Note (Signed)
Injection given today, tolerated the procedure well, discussed icing regimen and home exercise, which activities to do and which ones to avoid.  Patient could be a candidate again for PRP.  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  Patient's right knee does have a hematoma anteriorly.  We discussed heat massage that I think will be more beneficial.  Follow-up with me again if he does not make improvement in 2 to 3 months

## 2021-11-06 NOTE — Patient Instructions (Addendum)
Injections in both knees See you again in 2-3 months

## 2022-01-19 ENCOUNTER — Ambulatory Visit: Payer: Medicare Other | Admitting: Family Medicine

## 2022-02-10 NOTE — Progress Notes (Unsigned)
[name] [address] [place] [address] Phone: [phone] Subjective:   Jenny [name], am serving as a scribe for Dr. Antoine Primas.  I'm seeing this patient by the request  of:  Jenny [name]., MD  CC: Bilateral knee pain  DXI:[id number]  11/06/2021 Injection given today, tolerated the procedure well, discussed icing regimen and home exercise, which activities to do and which ones to avoid.  Patient could be a candidate again for PRP.  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  Patient's right knee does have a hematoma anteriorly.  We discussed heat massage that I think will be more beneficial.  Follow-up with me again if he does not make improvement in 2 to 3 months  Update 02/11/2022 Jenny [name] is a 71 y.o. female coming in with complaint of B knee pain. Patient states that the injections started to wear off by the end of August. She is going on a trip on Monday by bus to [place] for 10 days and would like some relief.      No past medical history on file. Past Surgical History:  Procedure Laterality Date   CHOLECYSTECTOMY     TONSILLECTOMY     Social History   Socioeconomic History   Marital status: Divorced    Spouse name: Not on file   Number of children: Not on file   Years of education: Not on file   Highest education level: Not on file  Occupational History   Not on file  Tobacco Use   Smoking status: Never   Smokeless tobacco: Never  Substance and Sexual Activity   Alcohol use: No   Drug use: No   Sexual activity: Not on file  Other Topics Concern   Not on file  Social History Narrative   Not on file   Social Determinants of Health   Financial Resource Strain: Not on file  Food Insecurity: Not on file  Transportation Needs: Not on file  Physical Activity: Not on file  Stress: Not on file  Social Connections: Not on file   Allergies  Allergen  Reactions   Demerol Itching   Meperidine Itching   Wound Dressing Adhesive    No family history on file.  Current Outpatient Medications (Endocrine & Metabolic):    levothyroxine (SYNTHROID, LEVOTHROID) 50 MCG tablet, Take by mouth.  Current Outpatient Medications (Cardiovascular):    valsartan (DIOVAN) 80 MG tablet, Take 80 mg by mouth.     Current Outpatient Medications (Other):    Diclofenac Sodium (PENNSAID) 2 % SOLN, Place 2 application onto the skin 2 (two) times daily.   DULoxetine (CYMBALTA) 60 MG capsule, Take 60 mg by mouth daily.     temazepam (RESTORIL) 15 MG capsule, Take 15 mg by mouth.   Reviewed prior external information including notes and imaging from  primary care provider As well as notes that were available from care everywhere and other healthcare systems.  Past medical history, social, surgical and family history all reviewed in electronic medical record.  No pertanent information unless stated regarding to the chief complaint.   Review of Systems:  No headache, visual changes, nausea, vomiting, diarrhea, constipation, dizziness, abdominal pain, skin rash, fevers, chills, night sweats, weight loss, swollen lymph nodes, body aches, joint swelling, chest pain, shortness of breath, mood changes. POSITIVE muscle aches  Objective  Blood pressure 128/70, pulse 85, height 5\' 2"  (1.575 m), weight 268 lb (  121.6 kg), SpO2 98 %.   General: No apparent distress alert and oriented x3 mood and affect normal, dressed appropriately.  HEENT: Pupils equal, extraocular movements intact  Respiratory: Patient's speak in full sentences and does not appear short of breath  Cardiovascular: No lower extremity edema, non tender, no erythema   bilateral knees show arthritic changes noted.  Abnormality of gait noted with antalgic.  After informed written and verbal consent, patient was seated on exam table. Right knee was prepped with alcohol swab and utilizing anterolateral  approach, patient's right knee space was injected with 4:1  marcaine 0.5%: Kenalog 40mg /dL. Patient tolerated the procedure well without immediate complications.  After informed written and verbal consent, patient was seated on exam table. Left knee was prepped with alcohol swab and utilizing anterolateral approach, patient's left knee space was injected with 4:1  marcaine 0.5%: Kenalog 40mg /dL. Patient tolerated the procedure well without immediate complications.     Impression and Recommendations:    The above documentation has been reviewed and is accurate and complete Jenny Pulley, DO

## 2022-02-11 ENCOUNTER — Ambulatory Visit: Payer: Medicare Other | Admitting: Family Medicine

## 2022-02-11 ENCOUNTER — Encounter: Payer: Self-pay | Admitting: Family Medicine

## 2022-02-11 DIAGNOSIS — M170 Bilateral primary osteoarthritis of knee: Secondary | ICD-10-CM | POA: Diagnosis not present

## 2022-02-11 NOTE — Patient Instructions (Signed)
Injected both knees today See me again in 10-12 weeks Have a great trip!

## 2022-02-11 NOTE — Assessment & Plan Note (Signed)
Repeat injection given today, discussed with him icing regimen and home exercises.  Discussed which activities to do and which ones to avoid.  Increase activity slowly.  Patient will continue to work on weight loss.  Has done remarkably well and is down another 3 pounds since last visit.  Patient had responded extremely well to PRP in November and may need to consider doing it again.  Follow-up with me again otherwise in 3 months

## 2022-04-21 NOTE — Progress Notes (Unsigned)
[name] [address] [place] [address] Phone: [phone] Subjective:   Jenny [name], am serving as a scribe for Dr. Antoine Primas.  I'm seeing this patient by the request  of:  [name]., MD  CC: Bilateral knee pain follow-up  DTO:[id number]  02/11/2022 Repeat injection given today, discussed with him icing regimen and home exercises.  Discussed which activities to do and which ones to avoid.  Increase activity slowly.  Patient will continue to work on weight loss.  Has done remarkably well and is down another 3 pounds since last visit.  Patient had responded extremely well to PRP in November and may need to consider doing it again.  Follow-up with me again otherwise in 3 months     Update 04/22/2022 Jenny [name] is a 71 y.o. female coming in with complaint of B knee pain.  Known severe arthritic changes.  Continues to avoid surgical intervention.  Patient states that she was going to get the PRP today but she wanted to hold off on that today and do another round of the steroid injections. States the knees are a little "grouchy"       No past medical history on file. Past Surgical History:  Procedure Laterality Date   CHOLECYSTECTOMY     TONSILLECTOMY     Social History   Socioeconomic History   Marital status: Divorced    Spouse name: Not on file   Number of children: Not on file   Years of education: Not on file   Highest education level: Not on file  Occupational History   Not on file  Tobacco Use   Smoking status: Never   Smokeless tobacco: Never  Substance and Sexual Activity   Alcohol use: No   Drug use: No   Sexual activity: Not on file  Other Topics Concern   Not on file  Social History Narrative   Not on file   Social Determinants of Health   Financial Resource Strain: Not on file  Food Insecurity: Not on file  Transportation Needs: Not on file  Physical Activity: Not on file  Stress: Not  on file  Social Connections: Not on file   Allergies  Allergen Reactions   Demerol Itching   Meperidine Itching   Wound Dressing Adhesive    No family history on file.  Current Outpatient Medications (Endocrine & Metabolic):    levothyroxine (SYNTHROID, LEVOTHROID) 50 MCG tablet, Take by mouth.  Current Outpatient Medications (Cardiovascular):    valsartan (DIOVAN) 80 MG tablet, Take 80 mg by mouth.     Current Outpatient Medications (Other):    Diclofenac Sodium (PENNSAID) 2 % SOLN, Place 2 application onto the skin 2 (two) times daily.   DULoxetine (CYMBALTA) 60 MG capsule, Take 60 mg by mouth daily.     temazepam (RESTORIL) 15 MG capsule, Take 15 mg by mouth.   Reviewed prior external information including notes and imaging from  primary care provider As well as notes that were available from care everywhere and other healthcare systems.  Past medical history, social, surgical and family history all reviewed in electronic medical record.  No pertanent information unless stated regarding to the chief complaint.   Review of Systems:  No headache, visual changes, nausea, vomiting, diarrhea, constipation, dizziness, abdominal pain, skin rash, fevers, chills, night sweats, weight loss, swollen lymph nodes, , chest pain, shortness of breath, mood changes. POSITIVE muscle aches, joint swelling and body aches  Objective  Blood  pressure 132/84, pulse 64, height 5\' 2"  (1.575 m), SpO2 98 %.   General: No apparent distress alert and oriented x3 mood and affect normal, dressed appropriately. Overweight  HEENT: Pupils equal, extraocular movements intact  Respiratory: Patient's speak in full sentences and does not appear short of breath  Cardiovascular: No lower extremity edema, non tender, no erythema  Bilateral knee exam shows the patient does have some crepitus noted.  Patient does have some instability noted with valgus and varus force.  After informed written and verbal consent,  patient was seated on exam table. Right knee was prepped with alcohol swab and utilizing anterolateral approach, patient's right knee space was injected with 4:1  marcaine 0.5%: Kenalog 40mg /dL. Patient tolerated the procedure well without immediate complications.  After informed written and verbal consent, patient was seated on exam table. Left knee was prepped with alcohol swab and utilizing anterolateral approach, patient's left knee space was injected with 4:1  marcaine 0.5%: Kenalog 40mg /dL. Patient tolerated the procedure well without immediate complications.    Impression and Recommendations:    The above documentation has been reviewed and is accurate and complete Lyndal Pulley, DO

## 2022-04-22 ENCOUNTER — Ambulatory Visit: Payer: Medicare Other | Admitting: Family Medicine

## 2022-04-22 VITALS — BP 132/84 | HR 64 | Ht 62.0 in

## 2022-04-22 DIAGNOSIS — M170 Bilateral primary osteoarthritis of knee: Secondary | ICD-10-CM | POA: Diagnosis not present

## 2022-04-22 NOTE — Assessment & Plan Note (Signed)
Chronic problem with worsening symptoms, did well with PRP previously and woud like to consider again, need pain improve now though it has elected to do more the steroid injection due to this chronic pain and worsening.  Patient still BMI does not allow her to have any surgical intervention.  Discussed with patient about icing regimen, home exercise, weight loss.  Patient has different medications for breakthrough pain if necessary.  Follow-up with me again in 8 to 10 weeks.

## 2022-04-22 NOTE — Patient Instructions (Addendum)
Good to see you Bilateral knee injections given today  Call us when you are ready for PRP

## 2022-08-27 ENCOUNTER — Encounter: Payer: Self-pay | Admitting: Family Medicine

## 2022-08-27 ENCOUNTER — Ambulatory Visit: Payer: Medicare Other | Admitting: Family Medicine

## 2022-08-27 VITALS — BP 122/74 | Ht 62.0 in | Wt 270.0 lb

## 2022-08-27 DIAGNOSIS — M170 Bilateral primary osteoarthritis of knee: Secondary | ICD-10-CM | POA: Diagnosis not present

## 2022-08-27 NOTE — Progress Notes (Signed)
Jenny ScaleZach Hammond [name] D.O. [name] [address] [place] [id number] Phone: [phone] Subjective:   Jenny [name], Jenny [name], am serving as a scribe for Dr. Antoine PrimasZachary Makynzee [name].  I'm seeing this patient by the request  of:  Jenny SchaumannVelazquez, Jenny Y., MD  CC: Bilateral knee pain  BJY:[id number]:Subjective  Jenny GinsSherry [name] is a 72 y.o. female coming in with complaint of B knee pain. Steroid injections given in November 2023. Patient states that she would like to try the gel injections again. Pain is constant.  Affecting daily activities.     No past medical history on file. Past Surgical History:  Procedure Laterality Date   CHOLECYSTECTOMY     TONSILLECTOMY     Social History   Socioeconomic History   Marital status: Divorced    Spouse name: Not on file   Number of children: Not on file   Years of education: Not on file   Highest education level: Not on file  Occupational History   Not on file  Tobacco Use   Smoking status: Never   Smokeless tobacco: Never  Substance and Sexual Activity   Alcohol use: No   Drug use: No   Sexual activity: Not on file  Other Topics Concern   Not on file  Social History Narrative   Not on file   Social Determinants of Health   Financial Resource Strain: Not on file  Food Insecurity: Not on file  Transportation Needs: Not on file  Physical Activity: Not on file  Stress: Not on file  Social Connections: Not on file   Allergies  Allergen Reactions   Demerol Itching   Meperidine Itching   Wound Dressing Adhesive    No family history on file.  Current Outpatient Medications (Endocrine & Metabolic):    levothyroxine (SYNTHROID, LEVOTHROID) 50 MCG tablet, Take by mouth.  Current Outpatient Medications (Cardiovascular):    valsartan (DIOVAN) 80 MG tablet, Take 80 mg by mouth.     Current Outpatient Medications (Other):    Diclofenac Sodium (PENNSAID) 2 % SOLN, Place 2 application onto the skin 2 (two) times daily.   DULoxetine (CYMBALTA)  60 MG capsule, Take 60 mg by mouth daily.     temazepam (RESTORIL) 15 MG capsule, Take 15 mg by mouth.   Reviewed prior external information including notes and imaging from  primary care provider As well as notes that were available from care everywhere and other healthcare systems.  Past medical history, social, surgical and family history all reviewed in electronic medical record.  No pertanent information unless stated regarding to the chief complaint.   Review of Systems:  No headache, visual changes, nausea, vomiting, diarrhea, constipation, dizziness, abdominal pain, skin rash, fevers, chills, night sweats, weight loss, swollen lymph nodes, body aches, joint swelling, chest pain, shortness of breath, mood changes. POSITIVE muscle aches  Objective  Blood pressure 122/74, height 5\' 2"  (1.575 m), weight 270 lb (122.5 kg).   General: No apparent distress alert and oriented x3 mood and affect normal, dressed appropriately.  Overweight HEENT: Pupils equal, extraocular movements intact  Respiratory: Patient's speak in full sentences and does not appear short of breath  Cardiovascular: Trace lower extremity edema, non tender, no erythema  Bilateral knees do have effusion noted.  Limited range of motion in all planes.  Crepitus noted with range of motion.  Instability noted with valgus and varus force  After informed written and verbal consent, patient was seated on exam table. Right knee was prepped with alcohol swab and  utilizing anterolateral approach, patient's right knee space was injected with 4:1  marcaine 0.5%: Kenalog 40mg /dL. Patient tolerated the procedure well without immediate complications.  After informed written and verbal consent, patient was seated on exam table. Left knee was prepped with alcohol swab and utilizing anterolateral approach, patient's left knee space was injected with 4:1  marcaine 0.5%: Kenalog 40mg /dL. Patient tolerated the procedure well without immediate  complications.    Impression and Recommendations:    The above documentation has been reviewed and is accurate and complete Jenny Saa, DO

## 2022-08-27 NOTE — Assessment & Plan Note (Signed)
Chronic problem with worsening symptoms.  Associated general health includes patient's morbid obesity with a BMI of 49 but does not make a surgical candidate.  Encouraged weight loss, continuing to be active.  Will see if we can get approval for viscosupplementation.  Patient has had varying degrees of success with the viscosupplementation.  Can also consider the possibility of PRP.  Follow-up with me again in 6 to 8 weeks

## 2022-08-27 NOTE — Patient Instructions (Addendum)
Injected both knees today Will get approval for gel If you want to do PRP let me know See me again in 6-8 weeks

## 2022-09-12 ENCOUNTER — Other Ambulatory Visit: Payer: Self-pay

## 2022-09-12 ENCOUNTER — Emergency Department (HOSPITAL_BASED_OUTPATIENT_CLINIC_OR_DEPARTMENT_OTHER)
Admission: EM | Admit: 2022-09-12 | Discharge: 2022-09-12 | Disposition: A | Discharge status: Home / Self Care | Payer: Medicare Other | Attending: Emergency Medicine | Admitting: Emergency Medicine

## 2022-09-12 ENCOUNTER — Encounter (HOSPITAL_BASED_OUTPATIENT_CLINIC_OR_DEPARTMENT_OTHER): Payer: Self-pay | Admitting: Emergency Medicine

## 2022-09-12 DIAGNOSIS — I10 Essential (primary) hypertension: Secondary | ICD-10-CM | POA: Diagnosis not present

## 2022-09-12 DIAGNOSIS — Z79899 Other long term (current) drug therapy: Secondary | ICD-10-CM | POA: Insufficient documentation

## 2022-09-12 DIAGNOSIS — H66001 Acute suppurative otitis media without spontaneous rupture of ear drum, right ear: Secondary | ICD-10-CM | POA: Diagnosis not present

## 2022-09-12 DIAGNOSIS — H9201 Otalgia, right ear: Secondary | ICD-10-CM | POA: Diagnosis present

## 2022-09-12 HISTORY — DX: Essential (primary) hypertension: I10

## 2022-09-12 MED ORDER — AMOXICILLIN 500 MG PO CAPS: 500.0000 mg | ORAL_CAPSULE | Freq: Two times a day (BID) | ORAL | 0 refills | Status: AC

## 2022-09-12 NOTE — Discharge Instructions (Addendum)
You have been seen today for your complaint of right ear pain. Your discharge medications include amoxicillin. This is an antibiotic. You should take it as prescribed. You should take it for the entire duration of the prescription. This may cause an upset stomach. This is normal. You may take this with food. You may also eat yogurt to prevent diarrhea. . Home care instructions are as follows:  Use Claritin-D and Flonase as instructed on the packaging in order to help her symptoms Follow up with: Your primary care provider in 7 days for reevaluation of your symptoms Please seek immediate medical care if you develop any of the following symptoms: You have severe pain that is not controlled with medicine. You have swelling, redness, or pain around your ear. You have stiffness in your neck. A part of your face is not moving (paralyzed). The bone behind your ear (mastoid bone) is tender when you touch it. You develop a severe headache. At this time there does not appear to be the presence of an emergent medical condition, however there is always the potential for conditions to change. Please read and follow the below instructions.  Do not take your medicine if  develop an itchy rash, swelling in your mouth or lips, or difficulty breathing; call 911 and seek immediate emergency medical attention if this occurs.  You may review your lab tests and imaging results in their entirety on your MyChart account.  Please discuss all results of fully with your primary care provider and other specialist at your follow-up visit.  Note: Portions of this text may have been transcribed using voice recognition software. Every effort was made to ensure accuracy; however, inadvertent computerized transcription errors may still be present.

## 2022-09-12 NOTE — ED Triage Notes (Signed)
Patient presents to ED via POV from home. Here with right ear pain. Also reports cough.

## 2022-09-12 NOTE — ED Provider Notes (Signed)
[facility] Provider Note   CSN: [id number] Arrival date & time: 09/12/22  1550     History  Chief Complaint  Patient presents with   Ear Pain    Jenny [name] is a 72 y.o. female.  With history of hypertension who presents to the ED for evaluation of right ear pain.  States this began yesterday and has gotten worse.  It feels like a full sensation.  She denies tinnitus or hearing loss.  She reports a history of seasonal allergies and states she has been exposed to quite a bit of pollen lately.  She used NyQuil last night with some improvement but has not used anything today.  She also reports an infrequent cough that is nonproductive.  She denies shortness of breath, fevers, chills, sore throat, difficulty swallowing, congestion, rhinorrhea.  HPI     Home Medications Prior to Admission medications   Medication Sig Start Date End Date Taking? Authorizing Provider  amoxicillin (AMOXIL) 500 MG capsule Take 1 capsule (500 mg total) by mouth 2 (two) times daily for 7 days. 09/12/22 09/19/22 Yes Avien Taha, Edsel Petrin, PA-C  Diclofenac Sodium (PENNSAID) 2 % SOLN Place 2 application onto the skin 2 (two) times daily. 11/04/15   Judi Saa, DO  DULoxetine (CYMBALTA) 60 MG capsule Take 60 mg by mouth daily.      [provider]  levothyroxine (SYNTHROID, LEVOTHROID) 50 MCG tablet Take by mouth. 11/05/16 11/05/17  [provider]  temazepam (RESTORIL) 15 MG capsule Take 15 mg by mouth. 11/05/16   [provider]  valsartan (DIOVAN) 80 MG tablet Take 80 mg by mouth. 11/03/16 11/03/17  [provider]      Allergies    Demerol, Meperidine, and Wound dressing adhesive    Review of Systems   Review of Systems  HENT:  Positive for ear pain.   All other systems reviewed and are negative.   Physical Exam Updated Vital Signs BP (!) 159/81 (BP Location: Left Arm)   Pulse 85   Temp 98.5 F (36.9 C) (Oral)   Resp  20   SpO2 96%  Physical Exam Vitals and nursing note reviewed.  Constitutional:      General: She is not in acute distress.    Appearance: Normal appearance. She is normal weight. She is not ill-appearing.  HENT:     Head: Normocephalic and atraumatic.     Left Ear: Tympanic membrane and ear canal normal.     Ears:     Comments: Right canal normal.  TM erythematous, pale and bulging.  No mastoid tenderness or erythema    Mouth/Throat:     Mouth: Mucous membranes are moist.     Pharynx: Oropharynx is clear. No oropharyngeal exudate or posterior oropharyngeal erythema.  Pulmonary:     Effort: Pulmonary effort is normal. No respiratory distress.     Breath sounds: Normal breath sounds.  Abdominal:     General: Abdomen is flat.  Musculoskeletal:        General: Normal range of motion.     Cervical back: Neck supple. No rigidity or tenderness.  Lymphadenopathy:     Cervical: No cervical adenopathy.  Skin:    General: Skin is warm and dry.  Neurological:     General: No focal deficit present.     Mental Status: She is alert and oriented to person, place, and time.  Psychiatric:        Mood and Affect: Mood normal.  Behavior: Behavior normal.     ED Results / Procedures / Treatments   Labs (all labs ordered are listed, but only abnormal results are displayed) Labs Reviewed - No data to display  EKG None  Radiology No results found.  Procedures Procedures    Medications Ordered in ED Medications - No data to display  ED Course/ Medical Decision Making/ A&P                             Medical Decision Making This patient presents to the ED for concern of right hip pain, this involves an extensive number of treatment options, and is a complaint that carries with it a high risk of complications and morbidity.  The differential diagnosis includes otitis media/externa, perforated TM, mastoiditis  Co morbidities that complicate the patient evaluation    hypertension  Additional history obtained from: Nursing notes from this visit.  Afebrile, hemodynamically stable.  72 year old female presenting to the ED for evaluation of right ear pain.  This began yesterday and has gotten worse today.  Has a history of allergies and has been exposed to pollen recently.  On exam, her right TM is erythematous, bulging and pale.  Left TM is normal.  Bilateral canals are normal.  No mastoid tenderness or erythema.  Palates are soft.  Appears to have acute suppurative otitis media.  Will be treated with amoxicillin and encouraged to follow-up with her primary care provider in 7 days for reevaluation of her symptoms.  She was also encouraged to use Claritin and Flonase to help improve drainage.  Her cough is most likely from postnasal drip given her history of allergies.  She has no shortness of breath and cough is nonproductive.  Low suspicion for acute pulmonary emergencies.  She was given return precautions.  Stable at discharge.  At this time there does not appear to be any evidence of an acute emergency medical condition and the patient appears stable for discharge with appropriate outpatient follow up. Diagnosis was discussed with patient who verbalizes understanding of care plan and is agreeable to discharge. I have discussed return precautions with patient who verbalizes understanding. Patient encouraged to follow-up with their PCP within 1 week. All questions answered.  Note: Portions of this report may have been transcribed using voice recognition software. Every effort was made to ensure accuracy; however, inadvertent computerized transcription errors may still be present.        Final Clinical Impression(s) / ED Diagnoses Final diagnoses:  Non-recurrent acute suppurative otitis media of right ear without spontaneous rupture of tympanic membrane    Rx / DC Orders ED Discharge Orders          Ordered    amoxicillin (AMOXIL) 500 MG capsule  2 times  daily        09/12/22 1720              Mora Bellman 09/12/22 1722    Mardene Sayer, MD 09/12/22 [id number]

## 2022-09-21 ENCOUNTER — Ambulatory Visit: Payer: Medicare Other | Admitting: Family Medicine

## 2022-10-05 NOTE — Progress Notes (Deleted)
[name] [address] [place] [address] Phone: [phone] Subjective:    I'm seeing this patient by the request  of:  [name]., MD  CC:   [id number]  08/27/2022 Chronic problem with worsening symptoms.  Associated general health includes patient's morbid obesity with a BMI of 49 but does not make a surgical candidate.  Encouraged weight loss, continuing to be active.  Will see if we can get approval for viscosupplementation.  Patient has had varying degrees of success with the viscosupplementation.  Can also consider the possibility of PRP.  Follow-up with me again in 6 to 8 weeks      Update 10/12/2022 Jenny [name] is a 72 y.o. female coming in with complaint of B knee pain. Patient states       Past Medical History:  Diagnosis Date   Hypertension    Past Surgical History:  Procedure Laterality Date   CHOLECYSTECTOMY     TONSILLECTOMY     Social History   Socioeconomic History   Marital status: Divorced    Spouse name: Not on file   Number of children: Not on file   Years of education: Not on file   Highest education level: Not on file  Occupational History   Not on file  Tobacco Use   Smoking status: Never   Smokeless tobacco: Never  Substance and Sexual Activity   Alcohol use: No   Drug use: No   Sexual activity: Not on file  Other Topics Concern   Not on file  Social History Narrative   Not on file   Social Determinants of Health   Financial Resource Strain: Not on file  Food Insecurity: Not on file  Transportation Needs: Not on file  Physical Activity: Not on file  Stress: Not on file  Social Connections: Not on file   Allergies  Allergen Reactions   Demerol Itching   Meperidine Itching   Wound Dressing Adhesive    No family history on file.  Current Outpatient Medications (Endocrine & Metabolic):    levothyroxine (SYNTHROID, LEVOTHROID) 50 MCG tablet, Take by mouth.  Current  Outpatient Medications (Cardiovascular):    valsartan (DIOVAN) 80 MG tablet, Take 80 mg by mouth.     Current Outpatient Medications (Other):    Diclofenac Sodium (PENNSAID) 2 % SOLN, Place 2 application onto the skin 2 (two) times daily.   DULoxetine (CYMBALTA) 60 MG capsule, Take 60 mg by mouth daily.     temazepam (RESTORIL) 15 MG capsule, Take 15 mg by mouth.   Reviewed prior external information including notes and imaging from  primary care provider As well as notes that were available from care everywhere and other healthcare systems.  Past medical history, social, surgical and family history all reviewed in electronic medical record.  No pertanent information unless stated regarding to the chief complaint.   Review of Systems:  No headache, visual changes, nausea, vomiting, diarrhea, constipation, dizziness, abdominal pain, skin rash, fevers, chills, night sweats, weight loss, swollen lymph nodes, body aches, joint swelling, chest pain, shortness of breath, mood changes. POSITIVE muscle aches  Objective  There were no vitals taken for this visit.   General: No apparent distress alert and oriented x3 mood and affect normal, dressed appropriately.  HEENT: Pupils equal, extraocular movements intact  Respiratory: Patient's speak in full sentences and does not appear short of breath  Cardiovascular: No lower extremity edema, non tender, no erythema      Impression  and Recommendations:

## 2022-10-12 ENCOUNTER — Ambulatory Visit: Payer: Medicare Other | Admitting: Family Medicine

## 2022-11-10 NOTE — Progress Notes (Deleted)
[name] [address] [place] [address] Phone: [phone] Subjective:    I'm seeing this patient by the request  of:  [name]., MD  CC:   [id number]  08/27/2022 Chronic problem with worsening symptoms. Associated general health includes patient's morbid obesity with a BMI of 49 but does not make a surgical candidate. Encouraged weight loss, continuing to be active. Will see if we can get approval for viscosupplementation. Patient has had varying degrees of success with the viscosupplementation. Can also consider the possibility of PRP. Follow-up with me again in 6 to 8 weeks   Update 11/16/2022 Jenny [name] is a 72 y.o. female coming in with complaint of B knee pain. Patient states        Past Medical History:  Diagnosis Date   Hypertension    Past Surgical History:  Procedure Laterality Date   CHOLECYSTECTOMY     TONSILLECTOMY     Social History   Socioeconomic History   Marital status: Divorced    Spouse name: Not on file   Number of children: Not on file   Years of education: Not on file   Highest education level: Not on file  Occupational History   Not on file  Tobacco Use   Smoking status: Never   Smokeless tobacco: Never  Substance and Sexual Activity   Alcohol use: No   Drug use: No   Sexual activity: Not on file  Other Topics Concern   Not on file  Social History Narrative   Not on file   Social Determinants of Health   Financial Resource Strain: Not on file  Food Insecurity: Not on file  Transportation Needs: Not on file  Physical Activity: Not on file  Stress: Not on file  Social Connections: Not on file   Allergies  Allergen Reactions   Demerol Itching   Meperidine Itching   Wound Dressing Adhesive    No family history on file.  Current Outpatient Medications (Endocrine & Metabolic):    levothyroxine (SYNTHROID, LEVOTHROID) 50 MCG tablet, Take by mouth.  Current Outpatient  Medications (Cardiovascular):    valsartan (DIOVAN) 80 MG tablet, Take 80 mg by mouth.     Current Outpatient Medications (Other):    Diclofenac Sodium (PENNSAID) 2 % SOLN, Place 2 application onto the skin 2 (two) times daily.   DULoxetine (CYMBALTA) 60 MG capsule, Take 60 mg by mouth daily.     temazepam (RESTORIL) 15 MG capsule, Take 15 mg by mouth.   Reviewed prior external information including notes and imaging from  primary care provider As well as notes that were available from care everywhere and other healthcare systems.  Past medical history, social, surgical and family history all reviewed in electronic medical record.  No pertanent information unless stated regarding to the chief complaint.   Review of Systems:  No headache, visual changes, nausea, vomiting, diarrhea, constipation, dizziness, abdominal pain, skin rash, fevers, chills, night sweats, weight loss, swollen lymph nodes, body aches, joint swelling, chest pain, shortness of breath, mood changes. POSITIVE muscle aches  Objective  There were no vitals taken for this visit.   General: No apparent distress alert and oriented x3 mood and affect normal, dressed appropriately.  HEENT: Pupils equal, extraocular movements intact  Respiratory: Patient's speak in full sentences and does not appear short of breath  Cardiovascular: No lower extremity edema, non tender, no erythema      Impression and Recommendations:

## 2022-11-16 ENCOUNTER — Ambulatory Visit: Payer: Medicare Other | Admitting: Family Medicine

## 2022-12-16 NOTE — Progress Notes (Unsigned)
[name] [address] [place] [address] Phone: [phone] Subjective:   Jenny [name], am serving as a scribe for Dr. Antoine Primas.  I'm seeing this patient by the request  of:  [name]., MD  CC: knee pain   [id number]  08/27/2022 Chronic problem with worsening symptoms.  Associated general health includes patient's morbid obesity with a BMI of 49 but does not make a surgical candidate.  Encouraged weight loss, continuing to be active.  Will see if we can get approval for viscosupplementation.  Patient has had varying degrees of success with the viscosupplementation.  Can also consider the possibility of PRP.  Follow-up with me again in 6 to 8 weeks     Updated 12/21/2022 Jenny [name] is a 72 y.o. female coming in with complaint of bilateral knee pain. Patient states that both knees are bothering here today and she would like steroid injections.        Past Medical History:  Diagnosis Date   Hypertension    Past Surgical History:  Procedure Laterality Date   CHOLECYSTECTOMY     TONSILLECTOMY     Social History   Socioeconomic History   Marital status: Divorced    Spouse name: Not on file   Number of children: Not on file   Years of education: Not on file   Highest education level: Not on file  Occupational History   Not on file  Tobacco Use   Smoking status: Never   Smokeless tobacco: Never  Substance and Sexual Activity   Alcohol use: No   Drug use: No   Sexual activity: Not on file  Other Topics Concern   Not on file  Social History Narrative   Not on file   Social Determinants of Health   Financial Resource Strain: Not on file  Food Insecurity: Not on file  Transportation Needs: Not on file  Physical Activity: Not on file  Stress: Not on file  Social Connections: Not on file   Allergies  Allergen Reactions   Demerol Itching   Meperidine Itching   Wound Dressing Adhesive    No family  history on file.  Current Outpatient Medications (Endocrine & Metabolic):    levothyroxine (SYNTHROID, LEVOTHROID) 50 MCG tablet, Take by mouth.  Current Outpatient Medications (Cardiovascular):    valsartan (DIOVAN) 80 MG tablet, Take 80 mg by mouth.     Current Outpatient Medications (Other):    Diclofenac Sodium (PENNSAID) 2 % SOLN, Place 2 application onto the skin 2 (two) times daily.   DULoxetine (CYMBALTA) 60 MG capsule, Take 60 mg by mouth daily.     temazepam (RESTORIL) 15 MG capsule, Take 15 mg by mouth.   Reviewed prior external information including notes and imaging from  primary care provider As well as notes that were available from care everywhere and other healthcare systems.  Past medical history, social, surgical and family history all reviewed in electronic medical record.  No pertanent information unless stated regarding to the chief complaint.   Review of Systems:  No headache, visual changes, nausea, vomiting, diarrhea, constipation, dizziness, abdominal pain, skin rash, fevers, chills, night sweats, weight loss, swollen lymph nodes, body aches, joint swelling, chest pain, shortness of breath, mood changes. POSITIVE muscle aches  Objective  Blood pressure 108/64, pulse 72, height 5\' 2"  (1.575 m), weight 270 lb (122.5 kg), SpO2 97%.   General: No apparent distress alert and oriented x3 mood and affect normal, dressed appropriately.  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 Instability noted.lacks the 90 degrees of bend.   After informed written and verbal consent, patient was seated on exam table. Right knee was prepped with alcohol swab and utilizing anterolateral approach, patient's right knee space was injected with 4:1  marcaine 0.5%: Kenalog 40mg /dL. Patient tolerated the procedure well without  immediate complications.  After informed written and verbal consent, patient was seated on exam table. Left knee was prepped with alcohol swab and utilizing anterolateral approach, patient's left knee space was injected with 4:1  marcaine 0.5%: Kenalog 40mg /dL. Patient tolerated the procedure well without immediate complications.    Impression and Recommendations:    The above documentation has been reviewed and is accurate and complete Judi Saa, DO

## 2022-12-21 ENCOUNTER — Encounter: Payer: Self-pay | Admitting: Family Medicine

## 2022-12-21 ENCOUNTER — Ambulatory Visit: Payer: Medicare Other | Admitting: Family Medicine

## 2022-12-21 VITALS — BP 108/64 | HR 72 | Ht 62.0 in | Wt 270.0 lb

## 2022-12-21 DIAGNOSIS — M170 Bilateral primary osteoarthritis of knee: Secondary | ICD-10-CM

## 2022-12-21 NOTE — Patient Instructions (Signed)
Dont let people kidnap you on buses See me in 2 months

## 2022-12-21 NOTE — Assessment & Plan Note (Signed)
Chronic exacerbation, discussed HEP  Bilateral injections given today, tolerated the procedure well, discussed icing regimen and home exercises, increase activity slowly.  Follow-up again in 6 to 8 weeks otherwise.  Patient would like to consider PRP again.  Has done relatively well and is nearly 1 year out from the PRP again.  Follow-up with me again in 8 weeks

## 2023-02-22 ENCOUNTER — Ambulatory Visit: Payer: Medicare Other | Admitting: Family Medicine

## 2023-02-28 ENCOUNTER — Encounter: Payer: Self-pay | Admitting: Family Medicine

## 2023-03-08 NOTE — Progress Notes (Unsigned)
[name] [address] [place] [address] Phone: [phone] Subjective:   INadine Counts, am serving as a scribe for Dr. Antoine Primas.  I'm seeing this patient by the request  of:  [name]., MD  CC: Bilateral knee pain  [id number]  Jenny [name] is a 72 y.o. female coming in with complaint of B knee pain. Here for steroid injections. Patient states here for knee injections. Getting PRP 10/30. Fell Sunday and hit her R knee. States had some mild swelling but may be aggravated the underlying arthritis      Past Medical History:  Diagnosis Date   Hypertension    Past Surgical History:  Procedure Laterality Date   CHOLECYSTECTOMY     TONSILLECTOMY     Social History   Socioeconomic History   Marital status: Divorced    Spouse name: Not on file   Number of children: Not on file   Years of education: Not on file   Highest education level: Not on file  Occupational History   Not on file  Tobacco Use   Smoking status: Never   Smokeless tobacco: Never  Substance and Sexual Activity   Alcohol use: No   Drug use: No   Sexual activity: Not on file  Other Topics Concern   Not on file  Social History Narrative   Not on file   Social Determinants of Health   Financial Resource Strain: Not on file  Food Insecurity: Not on file  Transportation Needs: Not on file  Physical Activity: Not on file  Stress: Not on file  Social Connections: Not on file   Allergies  Allergen Reactions   Demerol Itching   Meperidine Itching   Wound Dressing Adhesive    No family history on file.  Current Outpatient Medications (Endocrine & Metabolic):    levothyroxine (SYNTHROID, LEVOTHROID) 50 MCG tablet, Take by mouth.  Current Outpatient Medications (Cardiovascular):    valsartan (DIOVAN) 80 MG tablet, Take 80 mg by mouth.     Current Outpatient Medications (Other):    Diclofenac Sodium (PENNSAID) 2 % SOLN, Place 2  application onto the skin 2 (two) times daily.   DULoxetine (CYMBALTA) 60 MG capsule, Take 60 mg by mouth daily.     temazepam (RESTORIL) 15 MG capsule, Take 15 mg by mouth.   Reviewed prior external information including notes and imaging from  primary care provider As well as notes that were available from care everywhere and other healthcare systems.  Past medical history, social, surgical and family history all reviewed in electronic medical record.  No pertanent information unless stated regarding to the chief complaint.   Review of Systems:  No headache, visual changes, nausea, vomiting, diarrhea, constipation, dizziness, abdominal pain, skin rash, fevers, chills, night sweats, weight loss, swollen lymph nodes, body aches, chest pain, shortness of breath, mood changes. POSITIVE muscle aches, joint swelling  Objective  Blood pressure 136/78, pulse 76, height 5\' 2"  (1.575 m), weight 261 lb (118.4 kg), SpO2 96%.   General: No apparent distress alert and oriented x3 mood and affect normal, dressed appropriately.  HEENT: Pupils equal, extraocular movements intact  Respiratory: Patient's speak in full sentences and does not appear short of breath  Cardiovascular: No lower extremity edema, non tender, no erythema  Does have significant tightness of the knee as well as swelling noted.  Hematoma noted on the lateral aspect of the right knee but very minimal but still severely tender to palpation.  After informed written and verbal consent, patient was seated on exam table. Right knee was prepped with alcohol swab and utilizing anterolateral approach, patient's right knee space was injected with 4:1  marcaine 0.5%: Kenalog 40mg /dL. Patient tolerated the procedure well without immediate complications.   After informed written and verbal consent, patient was seated on exam table. Left knee was prepped with alcohol swab and utilizing anterolateral approach, patient's left knee space was injected  with 4:1  marcaine 0.5%: Kenalog 40mg /dL. Patient tolerated the procedure well without immediate complications.    Impression and Recommendations:     The above documentation has been reviewed and is accurate and complete Judi Saa, DO

## 2023-03-09 ENCOUNTER — Ambulatory Visit: Payer: Medicare Other | Admitting: Family Medicine

## 2023-03-09 VITALS — BP 136/78 | HR 76 | Ht 62.0 in | Wt 261.0 lb

## 2023-03-09 DIAGNOSIS — M170 Bilateral primary osteoarthritis of knee: Secondary | ICD-10-CM

## 2023-03-09 NOTE — Patient Instructions (Signed)
Injection in knees today See you again at your [facility] appointment

## 2023-03-09 NOTE — Assessment & Plan Note (Signed)
Chronic, with exacerbation.  Would like to try the PRP again.  Will come back probably in 2 weeks for that.  Discussed icing regimen and home exercises, which activities to do and which ones to avoid.  Increase activity slowly.  Follow-up again in 6 to 12 weeks

## 2023-03-17 NOTE — Progress Notes (Signed)
  [name] [address] [place] [address] Phone: [phone] Subjective:   Jenny [name], am serving as a scribe for Dr. Antoine Primas.  I'm seeing this patient by the request  of:  Jenny [name]., MD  CC: Bilateral knee pain  [id number]  03/09/2023 Chronic, with exacerbation.  Would like to try the PRP again.  Will come back probably in 2 weeks for that.  Discussed icing regimen and home exercises, which activities to do and which ones to avoid.  Increase activity slowly.  Follow-up again in 6 to 12 weeks     Updated 03/23/2023 Jenny [name] is a 72 y.o. female coming in with complaint of knee pain. Here for PRP    Objective  Blood pressure 118/72, pulse 72, height 5\' 2"  (1.575 m), weight 261 lb (118.4 kg), SpO2 98%.   General: No apparent distress alert and oriented x3 mood and affect normal, dressed appropriately.  HEENT: Pupils equal, extraocular movements intact   After informed written and verbal consent, patient was seated on exam table. Right knee was prepped with alcohol swab and utilizing anterolateral approach, patient's right knee space was injected with 1 cc of 0.5% Marcaine and then injected with 5 cc of PRP leukocyte poor patient tolerated the procedure well without immediate complications.  After informed written and verbal consent, patient was seated on exam table. Left knee was prepped with alcohol swab and utilizing anterolateral approach, patient's left knee space was injected with 1 cc of 0.5% Marcaine and then injected with 5 cc of PRP leukocyte poor. Patient tolerated the procedure well without immediate complications.    Impression and Recommendations:     The above documentation has been reviewed and is accurate and complete Judi Saa, DO

## 2023-03-23 ENCOUNTER — Ambulatory Visit (INDEPENDENT_AMBULATORY_CARE_PROVIDER_SITE_OTHER): Payer: Self-pay | Admitting: Family Medicine

## 2023-03-23 VITALS — BP 118/72 | HR 72 | Ht 62.0 in | Wt 261.0 lb

## 2023-03-23 DIAGNOSIS — M170 Bilateral primary osteoarthritis of knee: Secondary | ICD-10-CM

## 2023-03-23 NOTE — Patient Instructions (Signed)
No ice or IBU for 3 days Heat and Tylenol are ok See me again in 6 weeks 

## 2023-03-23 NOTE — Assessment & Plan Note (Signed)
PRP injections given again today.  Post PRP instructions given.

## 2023-05-05 NOTE — Progress Notes (Signed)
[name] [address] [place] [address] Phone: [phone] Subjective:   [name], am serving as a scribe for Dr. Janyce Llanos I'm seeing this patient by the request  of:  [name]., MD  CC: Bilateral knee pain follow-up  [id number]  03/23/2023 PRP injections  Update 05/09/2023 Jenny [name] is a 72 y.o. female coming in with complaint of B knee and R foot pain. Patient states that she feels like she developed plantar fasciitis when she was on her trip. Painful to walk. Does not notice pain after being sedentary. Pain in bottom of foot over heel.   Knee pain persists. After last visit she has pain over lateral aspect of L knee. Also states that just distal to injection on L knee she developed a large bruise.       Past Medical History:  Diagnosis Date   Hypertension    Past Surgical History:  Procedure Laterality Date   CHOLECYSTECTOMY     TONSILLECTOMY     Social History   Socioeconomic History   Marital status: Divorced    Spouse name: Not on file   Number of children: Not on file   Years of education: Not on file   Highest education level: Not on file  Occupational History   Not on file  Tobacco Use   Smoking status: Never   Smokeless tobacco: Never  Substance and Sexual Activity   Alcohol use: No   Drug use: No   Sexual activity: Not on file  Other Topics Concern   Not on file  Social History Narrative   Not on file   Social Drivers of Health   Financial Resource Strain: Not on file  Food Insecurity: Not on file  Transportation Needs: Not on file  Physical Activity: Not on file  Stress: Not on file  Social Connections: Not on file   Allergies  Allergen Reactions   Demerol Itching   Meperidine Itching   Wound Dressing Adhesive    No family history on file.  Current Outpatient Medications (Endocrine & Metabolic):    levothyroxine (SYNTHROID, LEVOTHROID) 50 MCG tablet, Take  by mouth.  Current Outpatient Medications (Cardiovascular):    valsartan (DIOVAN) 80 MG tablet, Take 80 mg by mouth.     Current Outpatient Medications (Other):    Diclofenac Sodium (PENNSAID) 2 % SOLN, Place 2 application onto the skin 2 (two) times daily.   DULoxetine (CYMBALTA) 60 MG capsule, Take 60 mg by mouth daily.     temazepam (RESTORIL) 15 MG capsule, Take 15 mg by mouth.   Reviewed prior external information including notes and imaging from  primary care provider As well as notes that were available from care everywhere and other healthcare systems.  Past medical history, social, surgical and family history all reviewed in electronic medical record.  No pertanent information unless stated regarding to the chief complaint.   Review of Systems:  No headache, visual changes, nausea, vomiting, diarrhea, constipation, dizziness, abdominal pain, skin rash, fevers, chills, night sweats, weight loss, swollen lymph nodes, body aches, joint swelling, chest pain, shortness of breath, mood changes. POSITIVE muscle aches  Objective  Blood pressure 132/78, pulse 65, height 5\' 2"  (1.575 m), weight 241 lb (109.3 kg), SpO2 97%.   General: No apparent distress alert and oriented x3 mood and affect normal, dressed appropriately.  HEENT: Pupils equal, extraocular movements intact  Respiratory: Patient's speak in full sentences and does not appear short of breath  Cardiovascular: No lower extremity edema, non tender, no erythema  Bilateral knee exam shows arthritic changes noted.  Foot exam does have some breakdown noted as well.  Seems to be the longitudinal arch noted. Right heel tender to palpation.  [id number]; 15 additional minutes spent for Therapeutic exercises as stated in above notes.  This included exercises focusing on stretching, strengthening, with significant focus on eccentric aspects.   Long term goals include an improvement in range of motion, strength, endurance as well as  avoiding reinjury. Patient's frequency would include in 1-2 times a day, 3-5 times a week for a duration of 6-12 weeks. Exercises for the foot include:  Stretches to help lengthen the lower leg and plantar fascia areas Theraband exercises for the lower leg and ankle to help strengthen the surrounding area- dorsiflexion, plantarflexion, inversion, eversion Massage rolling on the plantar surface of the foot with a frozen bottle, tennis ball or golf ball Towel or marble pick-ups to strengthen the plantar surface of the foot Weight bearing exercises to increase balance and overall stability    Proper technique shown and discussed handout in great detail with ATC.  All questions were discussed and answered.     Impression and Recommendations:     The above documentation has been reviewed and is accurate and complete Judi Saa, DO

## 2023-05-09 ENCOUNTER — Encounter: Payer: Self-pay | Admitting: Family Medicine

## 2023-05-09 ENCOUNTER — Ambulatory Visit: Payer: Medicare Other | Admitting: Family Medicine

## 2023-05-09 VITALS — BP 132/78 | HR 65 | Ht 62.0 in | Wt 241.0 lb

## 2023-05-09 DIAGNOSIS — G8929 Other chronic pain: Secondary | ICD-10-CM | POA: Insufficient documentation

## 2023-05-09 DIAGNOSIS — M170 Bilateral primary osteoarthritis of knee: Secondary | ICD-10-CM

## 2023-05-09 DIAGNOSIS — M79671 Pain in right foot: Secondary | ICD-10-CM | POA: Diagnosis not present

## 2023-05-09 NOTE — Assessment & Plan Note (Signed)
Multifactorial, discussed proper shoes, over-the-counter orthotics, given home exercises.  And new problem overall.  Should do well.  Follow-up with me again in 2 months

## 2023-05-09 NOTE — Patient Instructions (Addendum)
[employer] Support Orthotics Do prescribed exercises at least 3x a week [place] recovery sandals in the house  See me in 2 months

## 2023-05-09 NOTE — Assessment & Plan Note (Signed)
Stable at the moment.  Patient did do well after her long trip.  At the moment we can continue to monitor.  Will follow-up again in 2 to 3 months.

## 2023-07-06 NOTE — Progress Notes (Signed)
[name] [address] [place] [address] Phone: [phone] Subjective:   Jenny [name], am serving as a scribe for Dr. Antoine Primas.  I'm seeing this patient by the request  of:  [name]., MD  CC: Bilateral knee pain  [id number]  05/09/2023 Multifactorial, discussed proper shoes, over-the-counter orthotics, given home exercises.  And new problem overall.  Should do well.  Follow-up with me again in 2 months     Stable at the moment.  Patient did do well after her long trip.  At the moment we can continue to monitor.  Will follow-up again in 2 to 3 months      Update 07/13/2023 Jenny [name] is a 73 y.o. female coming in with complaint of B knee and R heel pain. Patient states knees not good. Still having R heel pain. Over all sore feeling in muscles in arms       Past Medical History:  Diagnosis Date   Hypertension    Past Surgical History:  Procedure Laterality Date   CHOLECYSTECTOMY     TONSILLECTOMY     Social History   Socioeconomic History   Marital status: Divorced    Spouse name: Not on file   Number of children: Not on file   Years of education: Not on file   Highest education level: Not on file  Occupational History   Not on file  Tobacco Use   Smoking status: Never   Smokeless tobacco: Never  Substance and Sexual Activity   Alcohol use: No   Drug use: No   Sexual activity: Not on file  Other Topics Concern   Not on file  Social History Narrative   Not on file   Social Drivers of Health   Financial Resource Strain: Not on file  Food Insecurity: Not on file  Transportation Needs: Not on file  Physical Activity: Not on file  Stress: Not on file  Social Connections: Not on file   Allergies  Allergen Reactions   Demerol Itching   Meperidine Itching   Wound Dressing Adhesive    No family history on file.  Current Outpatient Medications (Endocrine & Metabolic):    levothyroxine  (SYNTHROID, LEVOTHROID) 50 MCG tablet, Take by mouth.  Current Outpatient Medications (Cardiovascular):    valsartan (DIOVAN) 80 MG tablet, Take 80 mg by mouth.     Current Outpatient Medications (Other):    Diclofenac Sodium (PENNSAID) 2 % SOLN, Place 2 application onto the skin 2 (two) times daily.   DULoxetine (CYMBALTA) 60 MG capsule, Take 60 mg by mouth daily.     temazepam (RESTORIL) 15 MG capsule, Take 15 mg by mouth.   Reviewed prior external information including notes and imaging from  primary care provider As well as notes that were available from care everywhere and other healthcare systems.  Past medical history, social, surgical and family history all reviewed in electronic medical record.  No pertanent information unless stated regarding to the chief complaint.   Review of Systems:  No headache, visual changes, nausea, vomiting, diarrhea, constipation, dizziness, abdominal pain, skin rash, fevers, chills, night sweats, weight loss, swollen lymph nodes, body aches, joint swelling, chest pain, shortness of breath, mood changes. POSITIVE muscle aches  Objective  Blood pressure 130/80, pulse 77, height 5\' 2"  (1.575 m), weight 268 lb (121.6 kg), SpO2 96%.   General: No apparent distress alert and oriented x3 mood and affect normal, dressed appropriately.  HEENT: Pupils equal, extraocular movements  intact  Respiratory: Patient's speak in full sentences and does not appear short of breath  Cardiovascular: Trace peripheral edema noted of the lower extremities Patient's knees bilaterally do have arthritic changes noted.  Crepitus noted.  Instability noted as well.  After informed written and verbal consent, patient was seated on exam table. Right knee was prepped with alcohol swab and utilizing anterolateral approach, patient's right knee space was injected with 4:1  marcaine 0.5%: Kenalog 40mg /dL. Patient tolerated the procedure well without immediate complications.  After  informed written and verbal consent, patient was seated on exam table. Left knee was prepped with alcohol swab and utilizing anterolateral approach, patient's left knee space was injected with 4:1  marcaine 0.5%: Kenalog 40mg /dL. Patient tolerated the procedure well without immediate complications.     Impression and Recommendations:    The above documentation has been reviewed and is accurate and complete Judi Saa, DO

## 2023-07-13 ENCOUNTER — Encounter: Payer: Self-pay | Admitting: Family Medicine

## 2023-07-13 ENCOUNTER — Ambulatory Visit: Payer: Medicare Other | Admitting: Family Medicine

## 2023-07-13 VITALS — BP 130/80 | HR 77 | Ht 62.0 in | Wt 268.0 lb

## 2023-07-13 DIAGNOSIS — E538 Deficiency of other specified B group vitamins: Secondary | ICD-10-CM

## 2023-07-13 DIAGNOSIS — M170 Bilateral primary osteoarthritis of knee: Secondary | ICD-10-CM

## 2023-07-13 MED ORDER — CYANOCOBALAMIN 1000 MCG/ML IJ SOLN
1000.0000 ug | Freq: Once | INTRAMUSCULAR | Status: AC
  Administered 2023-07-13: 1000 ug via INTRAMUSCULAR

## 2023-07-13 NOTE — Assessment & Plan Note (Addendum)
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  Worsening symptoms at this moment again.  Patient wants to avoid any surgical intervention and does need to have a BMI under 40 to be even a surgical candidate.  Encouraged weight loss.  Discussed icing regimen and home exercises.  Follow-up again in 10 weeks patient has failed other injections over the course of time.

## 2023-07-13 NOTE — Assessment & Plan Note (Signed)
Injection given today, continue supplementation at home.  Follow-up again in 6 to 8 weeks

## 2023-07-13 NOTE — Patient Instructions (Addendum)
Knee injections B12 injection See you again in 10 weeks  See if you can send me the labs Dr. Lawerance Bach upstairs

## 2023-08-26 ENCOUNTER — Telehealth: Payer: Self-pay | Admitting: Internal Medicine

## 2023-08-26 NOTE — Telephone Encounter (Signed)
 Copied from CRM [id number]. Topic: Referral - Question >> Aug 26, 2023 11:14 AM Lennart Pall wrote: Reason for CRM: Dr Antoine Primas suggested the patient see Dr Lawerance Bach. I did let the patient know that she is not accepting patients at this time. Wanting to know that since another doctor referred her, if she would accept her as a new patient.

## 2023-08-26 NOTE — Telephone Encounter (Signed)
I Will accept her

## 2023-08-31 DIAGNOSIS — E559 Vitamin D deficiency, unspecified: Secondary | ICD-10-CM | POA: Insufficient documentation

## 2023-08-31 NOTE — Progress Notes (Unsigned)
 Subjective:    Patient ID: Jenny [name], female    DOB: [birth year], 73 y.o.   MRN: [id number]     HPI Jenny [name] is here to establish with a new pcp. She is here for follow up of her chronic medical problems.  ? Thyroid level off - itching, struggle to lose weight, and she is fatigued.  If she sits down she may fall asleep.    Sometimes at night - sleeps an hour and then wakes up and can not always go back to sleep.  Has a lot of cramp at night.  Increased water - drinks 64-80oz water daily.    Little bumps in her arm - they are sore - when pressing on them they are more sore -- started 3-4 weeks ago.  Thought she strained the muscles.  Chiropractor thought they may be cysts.  They feel hard.  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  The ones in her arms b/l hurt.   Works - sits - gets 3500-4000 steps a day.  Does water aerobics 2/week, sometimes does chair yoga.    Medications and allergies reviewed with patient and updated if appropriate.  Current Outpatient Medications on File Prior to Visit  Medication Sig Dispense Refill   Diclofenac Sodium (PENNSAID) 2 % SOLN Place 2 application onto the skin 2 (two) times daily. 112 g 3   DULoxetine (CYMBALTA) 60 MG capsule Take 60 mg by mouth daily.       temazepam (RESTORIL) 15 MG capsule Take 15 mg by mouth.     levothyroxine (SYNTHROID, LEVOTHROID) 50 MCG tablet Take by mouth.     valsartan (DIOVAN) 80 MG tablet Take 80 mg by mouth.     No current facility-administered medications on file prior to visit.     Review of Systems  Constitutional:  Negative for fever.  Respiratory:  Positive for shortness of breath (with strenuous activity). Negative for cough and wheezing.   Cardiovascular:  Positive for palpitations (occ - flutter) and leg swelling (occ LLE - ankle). Negative for chest pain.  Gastrointestinal:  Negative for abdominal pain, constipation and diarrhea.       No gerd  Musculoskeletal:  Positive for arthralgias.        Muscle cramping  Neurological:  Negative for light-headedness and headaches.  Psychiatric/Behavioral:  Positive for dysphoric mood. The patient is nervous/anxious.        Objective:   Vitals:   09/01/23 1136  Pulse: 68  Temp: 98.1 F (36.7 C)  SpO2: 98%   BP Readings from Last 3 Encounters:  07/13/23 130/80  05/09/23 132/78  03/23/23 118/72   Wt Readings from Last 3 Encounters:  09/01/23 264 lb (119.7 kg)  07/13/23 268 lb (121.6 kg)  05/09/23 241 lb (109.3 kg)   Body mass index is 48.29 kg/m.    Physical Exam Constitutional:      General: She is not in acute distress.    Appearance: Normal appearance.  HENT:     Head: Normocephalic and atraumatic.  Eyes:     Conjunctiva/sclera: Conjunctivae normal.  Cardiovascular:     Rate and Rhythm: Normal rate and regular rhythm.     Heart sounds: Normal heart sounds.  Pulmonary:     Effort: Pulmonary effort is normal. No respiratory distress.     Breath sounds: Normal breath sounds. No wheezing.  Musculoskeletal:     Cervical back: Neck supple.     Right lower leg: No edema.  Left lower leg: No edema.  Lymphadenopathy:     Cervical: No cervical adenopathy.  Skin:    General: Skin is warm and dry.     Findings: No rash.  Neurological:     Mental Status: She is alert. Mental status is at baseline.  Psychiatric:        Mood and Affect: Mood normal.        Behavior: Behavior normal.        No results found for: "WBC", "HGB", "HCT", "PLT", "GLUCOSE", "CHOL", "TRIG", "HDL", "LDLDIRECT", "LDLCALC", "ALT", "AST", "NA", "K", "CL", "CREATININE", "BUN", "CO2", "TSH", "PSA", "INR", "GLUF", "HGBA1C", "MICROALBUR"   Assessment & Plan:    See Problem List for Assessment and Plan of chronic medical problems.

## 2023-08-31 NOTE — Patient Instructions (Incomplete)
     It was nice to meet you.   Blood work was ordered.        Medications changes include :   None   [facility].  Call [phone] to schedule an appointment.    Return in about 6 months (around 03/02/2024) for Physical Exam.

## 2023-09-01 ENCOUNTER — Encounter: Payer: Self-pay | Admitting: Internal Medicine

## 2023-09-01 ENCOUNTER — Ambulatory Visit (INDEPENDENT_AMBULATORY_CARE_PROVIDER_SITE_OTHER): Admitting: Internal Medicine

## 2023-09-01 VITALS — BP 136/82 | HR 68 | Temp 98.1°F | Ht 62.0 in | Wt 264.0 lb

## 2023-09-01 DIAGNOSIS — E782 Mixed hyperlipidemia: Secondary | ICD-10-CM

## 2023-09-01 DIAGNOSIS — F3289 Other specified depressive episodes: Secondary | ICD-10-CM

## 2023-09-01 DIAGNOSIS — E559 Vitamin D deficiency, unspecified: Secondary | ICD-10-CM | POA: Diagnosis not present

## 2023-09-01 DIAGNOSIS — G479 Sleep disorder, unspecified: Secondary | ICD-10-CM

## 2023-09-01 DIAGNOSIS — E538 Deficiency of other specified B group vitamins: Secondary | ICD-10-CM | POA: Diagnosis not present

## 2023-09-01 DIAGNOSIS — F419 Anxiety disorder, unspecified: Secondary | ICD-10-CM

## 2023-09-01 DIAGNOSIS — E785 Hyperlipidemia, unspecified: Secondary | ICD-10-CM | POA: Insufficient documentation

## 2023-09-01 DIAGNOSIS — R252 Cramp and spasm: Secondary | ICD-10-CM | POA: Diagnosis not present

## 2023-09-01 DIAGNOSIS — I10 Essential (primary) hypertension: Secondary | ICD-10-CM | POA: Diagnosis not present

## 2023-09-01 DIAGNOSIS — R229 Localized swelling, mass and lump, unspecified: Secondary | ICD-10-CM

## 2023-09-01 DIAGNOSIS — R7303 Prediabetes: Secondary | ICD-10-CM | POA: Diagnosis not present

## 2023-09-01 DIAGNOSIS — E039 Hypothyroidism, unspecified: Secondary | ICD-10-CM

## 2023-09-01 DIAGNOSIS — N1831 Chronic kidney disease, stage 3a: Secondary | ICD-10-CM

## 2023-09-01 DIAGNOSIS — Z1231 Encounter for screening mammogram for malignant neoplasm of breast: Secondary | ICD-10-CM

## 2023-09-01 LAB — CBC WITH DIFFERENTIAL/PLATELET
Basophils Absolute: 0 10*3/uL (ref 0.0–0.1)
Basophils Relative: 0.7 % (ref 0.0–3.0)
Eosinophils Absolute: 0.1 10*3/uL (ref 0.0–0.7)
Eosinophils Relative: 1.4 % (ref 0.0–5.0)
HCT: 40 % (ref 36.0–46.0)
Hemoglobin: 12.7 g/dL (ref 12.0–15.0)
Lymphocytes Relative: 30.2 % (ref 12.0–46.0)
Lymphs Abs: 1.6 10*3/uL (ref 0.7–4.0)
MCHC: 31.9 g/dL (ref 30.0–36.0)
MCV: 78.7 fl (ref 78.0–100.0)
Monocytes Absolute: 0.4 10*3/uL (ref 0.1–1.0)
Monocytes Relative: 8.2 % (ref 3.0–12.0)
Neutro Abs: 3.2 10*3/uL (ref 1.4–7.7)
Neutrophils Relative %: 59.5 % (ref 43.0–77.0)
Platelets: 258 10*3/uL (ref 150.0–400.0)
RBC: 5.08 Mil/uL (ref 3.87–5.11)
RDW: 16.6 % — ABNORMAL HIGH (ref 11.5–15.5)
WBC: 5.3 10*3/uL (ref 4.0–10.5)

## 2023-09-01 LAB — COMPREHENSIVE METABOLIC PANEL WITH GFR
ALT: 14 U/L (ref 0–35)
AST: 21 U/L (ref 0–37)
Albumin: 4.3 g/dL (ref 3.5–5.2)
Alkaline Phosphatase: 90 U/L (ref 39–117)
BUN: 19 mg/dL (ref 6–23)
CO2: 29 meq/L (ref 19–32)
Calcium: 9.6 mg/dL (ref 8.4–10.5)
Chloride: 103 meq/L (ref 96–112)
Creatinine, Ser: 1.02 mg/dL (ref 0.40–1.20)
GFR: 55.81 mL/min — ABNORMAL LOW (ref >60.00)
Glucose, Bld: 96 mg/dL (ref 70–99)
Potassium: 4.3 meq/L (ref 3.5–5.1)
Sodium: 139 meq/L (ref 135–145)
Total Bilirubin: 0.7 mg/dL (ref 0.2–1.2)
Total Protein: 6.7 g/dL (ref 6.0–8.3)

## 2023-09-01 LAB — LIPID PANEL
Cholesterol: 221 mg/dL — ABNORMAL HIGH (ref 0–200)
HDL: 67.1 mg/dL (ref >39.00)
LDL Cholesterol: 133 mg/dL — ABNORMAL HIGH (ref 0–99)
NonHDL: 154.35
Total CHOL/HDL Ratio: 3
Triglycerides: 106 mg/dL (ref 0.0–149.0)
VLDL: 21.2 mg/dL (ref 0.0–40.0)

## 2023-09-01 LAB — VITAMIN B12: Vitamin B-12: 265 pg/mL (ref 211–911)

## 2023-09-01 LAB — TSH: TSH: 1.49 u[IU]/mL (ref 0.35–5.50)

## 2023-09-01 LAB — VITAMIN D 25 HYDROXY (VIT D DEFICIENCY, FRACTURES): VITD: 21.62 ng/mL — ABNORMAL LOW (ref 30.00–100.00)

## 2023-09-01 LAB — HEMOGLOBIN A1C: Hgb A1c MFr Bld: 5.9 % (ref 4.6–6.5)

## 2023-09-01 LAB — MAGNESIUM: Magnesium: 2.1 mg/dL (ref 1.5–2.5)

## 2023-09-01 NOTE — Assessment & Plan Note (Signed)
 Chronic Not currently taking B12 Did have a B12 injection mid Feb Check B12 level - deficiency may be contributing to muscle cramping

## 2023-09-01 NOTE — Assessment & Plan Note (Signed)
Chronic Controlled, stable Continue cymbalta 60 mg daily  

## 2023-09-01 NOTE — Assessment & Plan Note (Signed)
 Chronic BP well controlled Continue valsartan 80 mg daily Cmp, cbc

## 2023-09-01 NOTE — Assessment & Plan Note (Signed)
 Chronic Check lipids Not currently on any medication Regular exercise and healthy diet encouraged

## 2023-09-01 NOTE — Assessment & Plan Note (Signed)
 Chronic  Having some fatigue, itchy skin and difficulty losing weight Check tsh and will titrate med dose if needed Currently taking levothyroxine 50 mcg daily

## 2023-09-01 NOTE — Assessment & Plan Note (Signed)
 Chronic Intermittent Takes restoril 15mg  prn only -- continue using it as needed

## 2023-09-01 NOTE — Assessment & Plan Note (Signed)
 Chronic Cbc, cmp

## 2023-09-01 NOTE — Assessment & Plan Note (Signed)
 Chronic Lab Results  Component Value Date   HGBA1C 5.9 09/01/2023   Check a1c Low sugar / carb diet Stressed regular exercise Trying to lose weight

## 2023-09-01 NOTE — Assessment & Plan Note (Addendum)
 Having muscle cramping under rib, neck, tongue, bottom of abdomen If she does anything where she sweats - she takes extra potassium and IV liquid ? Vitamin Def - check some levels -- start B complex Has increased water intake so unlikely dehydration

## 2023-09-01 NOTE — Assessment & Plan Note (Addendum)
 Chronic Not currently taking vitamin d daily Check vitamin d level

## 2023-09-03 ENCOUNTER — Encounter: Payer: Self-pay | Admitting: Internal Medicine

## 2023-09-20 NOTE — Progress Notes (Unsigned)
 Jenny [name] [address] [place] [address] Phone: [phone] Subjective:   Jenny [name], am serving as a scribe for Dr. Ronnell [name].  I'm seeing this patient by the request  of:  Jenny Dauphin, MD  CC: Bilateral knee pain  BJY:[id number]  07/13/2023 Injection given today, continue supplementation at home.  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  Worsening symptoms at this moment again.  Patient wants to avoid any surgical intervention and does need to have a BMI under 40 to be even a surgical candidate.  Encouraged weight loss.  Discussed icing regimen and home exercises.  Follow-up again in 10 weeks patient has failed other injections over the course of time.      Update 09/21/2023 Jenny [name] is a 73 y.o. female coming in with complaint of B knee pain. Patient states that her pain is worsening. The past 2 weeks have been uncomfortable.   For past 6 weeks she has had pain into the R tricep. Able to flex arm. Chiropractor though patient is subluxing her arm. Arm is sore and she is unable to open the door or pump her gas.        Past Medical History:  Diagnosis Date   Hypertension    Past Surgical History:  Procedure Laterality Date   CHOLECYSTECTOMY     TONSILLECTOMY     Social History   Socioeconomic History   Marital status: Divorced    Spouse name: Not on file   Number of children: Not on file   Years of education: Not on file   Highest education level: Not on file  Occupational History   Not on file  Tobacco Use   Smoking status: Never   Smokeless tobacco: Never  Substance and Sexual Activity   Alcohol use: No   Drug use: No   Sexual activity: Not on file  Other Topics Concern   Not on file  Social History Narrative   Not on file   Social Drivers of Health   Financial Resource Strain: Not on file  Food Insecurity: Not on file  Transportation Needs: Not on  file  Physical Activity: Not on file  Stress: Not on file  Social Connections: Not on file   Allergies  Allergen Reactions   Tape Anaphylaxis and Rash    CLOTH TAPE IS OK (heart monitor electrodes/holter monitor  caused throat swelling) , Paper tape OK to use   Demerol Itching   Latex Rash   Meperidine Itching   Wound Dressing Adhesive    No family history on file.  Current Outpatient Medications (Endocrine & Metabolic):    levothyroxine (SYNTHROID, LEVOTHROID) 50 MCG tablet, Take by mouth.  Current Outpatient Medications (Cardiovascular):    valsartan (DIOVAN) 80 MG tablet, Take 80 mg by mouth.     Current Outpatient Medications (Other):    Diclofenac  Sodium (PENNSAID ) 2 % SOLN, Place 2 application onto the skin 2 (two) times daily.   DULoxetine (CYMBALTA) 60 MG capsule, Take 60 mg by mouth daily.     magnesium 30 MG tablet, Take 30 mg by mouth daily.   Potassium Chloride CR (MICRO-K) 8 MEQ CPCR capsule CR, Take 24 mEq by mouth.   temazepam (RESTORIL) 15 MG capsule, Take 15 mg by mouth at bedtime as needed for sleep.   Reviewed prior external information including notes and imaging from  primary care provider As well as notes that  were available from care everywhere and other healthcare systems.  Past medical history, social, surgical and family history all reviewed in electronic medical record.  No pertanent information unless stated regarding to the chief complaint.   Review of Systems:  No headache, visual changes, nausea, vomiting, diarrhea, constipation, dizziness, abdominal pain, skin rash, fevers, chills, night sweats, weight loss, swollen lymph nodes, body aches, joint swelling, chest pain, shortness of breath, mood changes. POSITIVE muscle aches  Objective  Blood pressure 122/78, pulse 76, height 5\' 2"  (1.575 m), weight 268 lb (121.6 kg), SpO2 97%.   General: No apparent distress alert and oriented x3 mood and affect normal, dressed appropriately.  HEENT:  Pupils equal, extraocular movements intact  Respiratory: Patient's speak in full sentences and does not appear short of breath  Cardiovascular: No lower extremity edema, non tender, no erythema   Knee exam does show some instability noted bilaterally.  Crepitus noted.  Limited range of motion.  Antalgic gait noted.  The right shoulder shows impingement noted.  Rotator cuff strength is intact.  After informed written and verbal consent, patient was seated on exam table. Right knee was prepped with alcohol swab and utilizing anterolateral approach, patient's right knee space was injected with 4:1  marcaine 0.5%: Kenalog  40mg /dL. Patient tolerated the procedure well without immediate complications.  After informed written and verbal consent, patient was seated on exam table. Left knee was prepped with alcohol swab and utilizing anterolateral approach, patient's left knee space was injected with 4:1  marcaine 0.5%: Kenalog  40mg /dL. Patient tolerated the procedure well without immediate complications.   Impression and Recommendations:    The above documentation has been reviewed and is accurate and complete Jenny Rideout M Graden Hoshino, DO

## 2023-09-21 ENCOUNTER — Ambulatory Visit (INDEPENDENT_AMBULATORY_CARE_PROVIDER_SITE_OTHER)

## 2023-09-21 ENCOUNTER — Encounter: Payer: Self-pay | Admitting: Internal Medicine

## 2023-09-21 ENCOUNTER — Encounter: Payer: Self-pay | Admitting: Family Medicine

## 2023-09-21 ENCOUNTER — Ambulatory Visit: Payer: Medicare Other | Admitting: Family Medicine

## 2023-09-21 ENCOUNTER — Telehealth: Payer: Self-pay

## 2023-09-21 VITALS — BP 122/78 | HR 76 | Ht 62.0 in | Wt 268.0 lb

## 2023-09-21 DIAGNOSIS — M25511 Pain in right shoulder: Secondary | ICD-10-CM

## 2023-09-21 DIAGNOSIS — M170 Bilateral primary osteoarthritis of knee: Secondary | ICD-10-CM | POA: Diagnosis not present

## 2023-09-21 NOTE — Assessment & Plan Note (Signed)
 Multifactorial.  Patient likely has more of impingement that is secondary to some underlying arthritis as well as some subacromial bursitis.  Will get x-rays to further evaluate.  Start with home exercises.  Follow-up again in 6 to 8 weeks.

## 2023-09-21 NOTE — Patient Instructions (Addendum)
 Hands in peripheral vision Exercises We will get gel approved We will see you in 6-8 weeks

## 2023-09-21 NOTE — Assessment & Plan Note (Signed)
 Repeat bilateral steroid injections today.  Patient wants to know if she would be a candidate for viscosupplementation again.  Has been sometime since we have tried that again.  Patient still trying to avoid surgical intervention.  BMI needs to get below 40.  Patient's BMI is at 49 today.  Discussed icing regimen and home exercises otherwise.  Increase activity slowly.  Follow-up again 10 weeks

## 2023-09-21 NOTE — Telephone Encounter (Signed)
 Patient will need an appointment once medication is stocked. Has follow up appointment on 11/02/23  Durolane approved for bilateral knees.  Patient  has a Fully [employer] HMO-POS plan runs on a calendar year with an effective date of 05/25/2023. Plan follows Medicare guidelines. Plan covers at 80% of allowable amount for Durolane J7318 and 100% of allowable amount for procedure 20610/20611. Deductibles do not apply to these services. Patient has a $20 copay whether or not an office visit is billed. Only one copay applies per date of service. If out of pocket is met, coverage goes to 100% and copay will no longer apply. No Pre-cert and referrals needed.  Case #: [id number] Exp: 03/22/2024

## 2023-09-22 NOTE — Telephone Encounter (Signed)
Noted on next appointment.  

## 2023-10-05 LAB — HM MAMMOGRAPHY

## 2023-10-13 ENCOUNTER — Encounter: Payer: Self-pay | Admitting: Internal Medicine

## 2023-10-13 ENCOUNTER — Ambulatory Visit: Payer: Self-pay | Admitting: Internal Medicine

## 2023-10-13 DIAGNOSIS — R229 Localized swelling, mass and lump, unspecified: Secondary | ICD-10-CM | POA: Insufficient documentation

## 2023-10-20 ENCOUNTER — Other Ambulatory Visit: Payer: Self-pay | Admitting: Internal Medicine

## 2023-10-20 NOTE — Telephone Encounter (Unsigned)
 Copied from CRM [id number]. Topic: Clinical - Medication Refill >> Oct 20, 2023  4:52 PM Felizardo Hotter wrote: Medication: DULoxetine (CYMBALTA) 60 MG capsule  Has the patient contacted their pharmacy? Yes (Agent: If no, request that the patient contact the pharmacy for the refill. If patient does not wish to contact the pharmacy document the reason why and proceed with request.) (Agent: If yes, when and what did the pharmacy advise?)  This is the patient's preferred pharmacy:  [id number] - [place], [place] - [address] [place] [address] Phone: [phone] Fax: [phone]  Is this the correct pharmacy for this prescription? Yes If no, delete pharmacy and type the correct one.   Has the prescription been filled recently? Yes  Is the patient out of the medication? Yes  Has the patient been seen for an appointment in the last year OR does the patient have an upcoming appointment? Yes  Can we respond through MyChart? Yes  Agent: Please be advised that Rx refills may take up to 3 business days. We ask that you follow-up with your pharmacy.

## 2023-10-21 MED ORDER — DULOXETINE HCL 60 MG PO CPEP: 60.0000 mg | ORAL_CAPSULE | Freq: Every day | ORAL | 1 refills | Status: DC

## 2023-11-01 NOTE — Progress Notes (Unsigned)
 [facility] [address] [place] [address] Phone: [phone] Subjective:   Jenny [name], am serving as a scribe for Dr. Ronnell Coins.  I'm seeing this patient by the request  of:  Colene Dauphin, MD  CC: knee pain follow up   [id number]  09/21/2023 Multifactorial. Patient likely has more of impingement that is secondary to some underlying arthritis as well as some subacromial bursitis. Will get x-rays to further evaluate. Start with home exercises. Follow-up again in 6 to 8 weeks.  Repeat bilateral steroid injections today.  Patient wants to know if she would be a candidate for viscosupplementation again.  Has been sometime since we have tried that again.  Patient still trying to avoid surgical intervention.  BMI needs to get below 40.  Patient's BMI is at 49 today.  Discussed icing regimen and home exercises otherwise.  Increase activity slowly.  Follow-up again 10 weeks     Updated 11/02/2023 Jenny [name] is a 73 y.o. female coming in with complaint of R shoulder and B knee pain. Patient has been trying to eat less carbs for past 2 weeks. Noticed that she has less pain and swelling in her knees. Also drinking hydrogen water.   Shoulder is doing a lot better. Chiropractor adjusts shoulder for her. Doing HEP. Shoulder IR and ER is most painful.   Shoulder xray show mild AC arthritis    Past Medical History:  Diagnosis Date   Hypertension    Past Surgical History:  Procedure Laterality Date   CHOLECYSTECTOMY     TONSILLECTOMY     Social History   Socioeconomic History   Marital status: Divorced    Spouse name: Not on file   Number of children: Not on file   Years of education: Not on file   Highest education level: Not on file  Occupational History   Not on file  Tobacco Use   Smoking status: Never   Smokeless tobacco: Never  Substance and Sexual Activity   Alcohol use: No   Drug use: No   Sexual activity: Not on file   Other Topics Concern   Not on file  Social History Narrative   Not on file   Social Drivers of Health   Financial Resource Strain: Not on file  Food Insecurity: Not on file  Transportation Needs: Not on file  Physical Activity: Not on file  Stress: Not on file  Social Connections: Not on file   Allergies  Allergen Reactions   Tape Anaphylaxis and Rash    CLOTH TAPE IS OK (heart monitor electrodes/holter monitor  caused throat swelling) , Paper tape OK to use   Demerol Itching   Latex Rash   Meperidine Itching   Wound Dressing Adhesive    No family history on file.  Current Outpatient Medications (Endocrine & Metabolic):    levothyroxine (SYNTHROID, LEVOTHROID) 50 MCG tablet, Take by mouth.  Current Outpatient Medications (Cardiovascular):    valsartan (DIOVAN) 80 MG tablet, Take 80 mg by mouth.     Current Outpatient Medications (Other):    Diclofenac  Sodium (PENNSAID ) 2 % SOLN, Place 2 application onto the skin 2 (two) times daily.   DULoxetine  (CYMBALTA ) 60 MG capsule, Take 1 capsule (60 mg total) by mouth daily.   magnesium 30 MG tablet, Take 30 mg by mouth daily.   Potassium Chloride CR (MICRO-K) 8 MEQ CPCR capsule CR, Take 24 mEq by mouth.   temazepam (RESTORIL) 15 MG capsule, Take 15  mg by mouth at bedtime as needed for sleep.   Reviewed prior external information including notes and imaging from  primary care provider As well as notes that were available from care everywhere and other healthcare systems.  Past medical history, social, surgical and family history all reviewed in electronic medical record.  No pertanent information unless stated regarding to the chief complaint.   Review of Systems:  No headache, visual changes, nausea, vomiting, diarrhea, constipation, dizziness, abdominal pain, skin rash, fevers, chills, night sweats, weight loss, swollen lymph nodes, body aches, joint swelling, chest pain, shortness of breath, mood changes. POSITIVE muscle  aches  Objective  Blood pressure 116/80, pulse 67, height 5' 2 (1.575 m), weight 262 lb (118.8 kg), SpO2 96%.   General: No apparent distress alert and oriented x3 mood and affect normal, dressed appropriately.  HEENT: Pupils equal, extraocular movements intact  Respiratory: Patient's speak in full sentences and does not appear short of breath  Cardiovascular: No lower extremity edema, non tender, no erythema  Knee exam shows significant arthritic changes noted in the knees.  Tender to palpation mostly over the medial joint line.  After informed written and verbal consent, patient was seated on exam table. Right knee was prepped with alcohol swab and utilizing anterolateral approach, patient's right knee space was injected with 60 mg per 3 mL of Durolane (sodium hyaluronate) in a prefilled syringe was injected easily into the knee through a 22-gauge needle..Patient tolerated the procedure well without immediate complications.  After informed written and verbal consent, patient was seated on exam table. Left knee was prepped with alcohol swab and utilizing anterolateral approach, patient's left knee space was injected with 60 mg per 3 mL of Durolane (sodium hyaluronate) in a prefilled syringe was injected easily into the knee through a 22-gauge needle..Patient tolerated the procedure well without immediate complications.  Shoulder exam shows     Impression and Recommendations:    The above documentation has been reviewed and is accurate and complete Laksh Hinners M Orestes Geiman, DO

## 2023-11-02 ENCOUNTER — Ambulatory Visit: Admitting: Family Medicine

## 2023-11-02 ENCOUNTER — Encounter: Payer: Self-pay | Admitting: Family Medicine

## 2023-11-02 VITALS — BP 116/80 | HR 67 | Ht 62.0 in | Wt 262.0 lb

## 2023-11-02 DIAGNOSIS — M170 Bilateral primary osteoarthritis of knee: Secondary | ICD-10-CM

## 2023-11-02 MED ORDER — SODIUM HYALURONATE 60 MG/3ML IX PRSY
120.0000 mg | PREFILLED_SYRINGE | Freq: Once | INTRA_ARTICULAR | Status: AC
Start: 2023-11-02 — End: 2023-11-02
  Administered 2023-11-02: 120 mg via INTRA_ARTICULAR

## 2023-11-02 NOTE — Assessment & Plan Note (Signed)
 Discussed icing regimen and home exercises, continuing to stay active.  Patient has lost weight since we have seen her previously.  Encouraged her to continue to do so.  Down another 6 pounds.  Has responded well to the viscosupplementation some and hopefully will again.  Follow-up with me again in 3 months otherwise

## 2023-11-02 NOTE — Patient Instructions (Signed)
 See me in 3 months

## 2024-01-25 ENCOUNTER — Ambulatory Visit (INDEPENDENT_AMBULATORY_CARE_PROVIDER_SITE_OTHER)

## 2024-01-25 VITALS — Ht 62.0 in | Wt 262.0 lb

## 2024-01-25 DIAGNOSIS — Z780 Asymptomatic menopausal state: Secondary | ICD-10-CM

## 2024-01-25 DIAGNOSIS — Z0000 Encounter for general adult medical examination without abnormal findings: Secondary | ICD-10-CM

## 2024-01-25 NOTE — Progress Notes (Signed)
 Subjective:   Jenny [name] is a 73 y.o. who presents for a Medicare Wellness preventive visit.  As a reminder, Annual Wellness Visits don't include a physical exam, and some assessments may be limited, especially if this visit is performed virtually. We may recommend an in-person follow-up visit with your provider if needed.  Visit Complete: Virtual I connected with  Joen Fischer on 01/25/24 by a audio enabled telemedicine application and verified that I am speaking with the correct person using two identifiers.  Patient Location: Home  Provider Location: Home Office  I discussed the limitations of evaluation and management by telemedicine. The patient expressed understanding and agreed to proceed.  Vital Signs: Because this visit was a virtual/telehealth visit, some criteria may be missing or patient reported. Any vitals not documented were not able to be obtained and vitals that have been documented are patient reported.  VideoDeclined- This patient declined Librarian, academic. Therefore the visit was completed with audio only.  Persons Participating in Visit: Patient.  AWV Questionnaire: No: Patient Medicare AWV questionnaire was not completed prior to this visit.  Cardiac Risk Factors include: advanced age (>21men, >46 women);hypertension;Other (see comment);obesity (BMI >30kg/m2);dyslipidemia, Risk factor comments: CKD stage 3a     Objective:    Today's Vitals   01/25/24 1012  Weight: 262 lb (118.8 kg)  Height: 5' 2 (1.575 m)   Body mass index is 47.92 kg/m.     01/25/2024   10:20 AM  Advanced Directives  Does Patient Have a Medical Advance Directive? No    Current Medications (verified) Outpatient Encounter Medications as of 01/25/2024  Medication Sig   Diclofenac  Sodium (PENNSAID ) 2 % SOLN Place 2 application onto the skin 2 (two) times daily.   DULoxetine  (CYMBALTA ) 60 MG capsule Take 1 capsule (60 mg total) by mouth daily.    levothyroxine (SYNTHROID, LEVOTHROID) 50 MCG tablet Take by mouth.   magnesium 30 MG tablet Take 30 mg by mouth daily.   Potassium Chloride CR (MICRO-K) 8 MEQ CPCR capsule CR Take 24 mEq by mouth.   temazepam (RESTORIL) 15 MG capsule Take 15 mg by mouth at bedtime as needed for sleep.   valsartan (DIOVAN) 80 MG tablet Take 80 mg by mouth.   No facility-administered encounter medications on file as of 01/25/2024.    Allergies (verified) Tape, Demerol, Latex, Meperidine, and Wound dressing adhesive   History: Past Medical History:  Diagnosis Date   Hypertension    Past Surgical History:  Procedure Laterality Date   CHOLECYSTECTOMY     TONSILLECTOMY     History reviewed. No pertinent family history. Social History   Socioeconomic History   Marital status: Divorced    Spouse name: Not on file   Number of children: Not on file   Years of education: Not on file   Highest education level: Not on file  Occupational History   Occupation: Part time work/front desk  Tobacco Use   Smoking status: Never   Smokeless tobacco: Never  Vaping Use   Vaping status: Never Used  Substance and Sexual Activity   Alcohol use: No   Drug use: No   Sexual activity: Not on file  Other Topics Concern   Not on file  Social History Narrative   Lives alone and has 1 dog/2025   Social Drivers of Health   Financial Resource Strain: Low Risk  (01/25/2024)   Overall Financial Resource Strain (CARDIA)    Difficulty of Paying Living Expenses: Not hard at all  Food Insecurity: No Food Insecurity (01/25/2024)   Hunger Vital Sign    Worried About Running Out of Food in the Last Year: Never true    Ran Out of Food in the Last Year: Never true  Transportation Needs: No Transportation Needs (01/25/2024)   PRAPARE - Administrator, Civil Service (Medical): No    Lack of Transportation (Non-Medical): No  Physical Activity: Insufficiently Active (01/25/2024)   Exercise Vital Sign    Days of Exercise  per Week: 2 days    Minutes of Exercise per Session: 60 min  Stress: No Stress Concern Present (01/25/2024)   [employer] of Occupational Health - Occupational Stress Questionnaire    Feeling of Stress: Not at all  Social Connections: Moderately Integrated (01/25/2024)   Social Connection and Isolation Panel    Frequency of Communication with Friends and Family: More than three times a week    Frequency of Social Gatherings with Friends and Family: Twice a week    Attends Religious Services: More than 4 times per year    Active Member of [employer] or Organizations: Yes    Attends Engineer, structural: More than 4 times per year    Marital Status: Divorced    Tobacco Counseling Counseling given: Not Answered    Clinical Intake:  Pre-visit preparation completed: Yes  Pain : No/denies pain     BMI - recorded: 47.92 Nutritional Status: BMI > 30  Obese Nutritional Risks: None Diabetes: No  Lab Results  Component Value Date   HGBA1C 5.9 09/01/2023     How often do you need to have someone help you when you read instructions, pamphlets, or other written materials from your doctor or pharmacy?: 1 - Never  Interpreter Needed?: No  Information entered by :: Zakyra Kukuk, RMA   Activities of Daily Living ]    01/25/2024   10:13 AM  In your present state of health, do you have any difficulty performing the following activities:  Hearing? 0  Vision? 0  Difficulty concentrating or making decisions? 0  Walking or climbing stairs? 0  Dressing or bathing? 0  Doing errands, shopping? 0  Preparing Food and eating ? N  Using the Toilet? N  In the past six months, have you accidently leaked urine? Y  Do you have problems with loss of bowel control? N  Managing your Medications? N  Managing your Finances? N  Housekeeping or managing your Housekeeping? N    Patient Care Team: Geofm Glade PARAS, MD as PCP - General (Internal Medicine)  I have updated your Care Teams any  recent Medical Services you may have received from other providers in the past year.     Assessment:   This is a routine wellness examination for [facility].  Hearing/Vision screen Hearing Screening - Comments:: Denies hearing difficulties   Vision Screening - Comments:: Wears eyeglasses for reading/Dr. Hutto/[facility]   Goals Addressed               This Visit's Progress     Patient Stated (pt-stated)        Would like to lose weight/2025       Depression Screen     01/25/2024   10:23 AM  PHQ 2/9 Scores  PHQ - 2 Score 0  PHQ- 9 Score 2    Fall Risk     01/25/2024   10:20 AM  Fall Risk   Falls in the past year? 0  Number falls in  past yr: 0  Injury with Fall? 0  Follow up Falls evaluation completed;Falls prevention discussed    MEDICARE RISK AT HOME:  Medicare Risk at Home Any stairs in or around the home?: Yes (3 steps coming into home) If so, are there any without handrails?: No Home free of loose throw rugs in walkways, pet beds, electrical cords, etc?: Yes Adequate lighting in your home to reduce risk of falls?: Yes Life alert?: No Use of a cane, walker or w/c?: No Grab bars in the bathroom?: Yes Shower chair or bench in shower?: No Elevated toilet seat or a handicapped toilet?: No  TIMED UP AND GO:  Was the test performed?  No  Cognitive Function: Declined/Normal: No cognitive concerns noted by patient or family. Patient alert, oriented, able to answer questions appropriately and recall recent events. No signs of memory loss or confusion.        Immunizations Immunization History  Administered Date(s) Administered   Influenza,inj,Quad PF,6+ Mos 03/02/2016, 02/10/2018   Influenza-Unspecified 03/02/2016, 02/21/2018   Pneumococcal Conjugate-13 12/14/2018   Pneumococcal Polysaccharide-23 12/10/2019   Tdap 12/13/2013, 12/14/2018   Zoster Recombinant(Shingrix) 12/14/2018, 09/02/2021    Screening Tests Health Maintenance  Topic Date Due    Hepatitis C Screening  Never done   Colonoscopy  Never done   DEXA SCAN  Never done   Medicare Annual Wellness (AWV)  09/03/2022   INFLUENZA VACCINE  12/23/2023   COVID-19 Vaccine (1 - 2024-25 season) Never done   MAMMOGRAM  10/04/2025   DTaP/Tdap/Td (3 - Td or Tdap) 12/13/2028   Pneumococcal Vaccine: 50+ Years  Completed   Zoster Vaccines- Shingrix  Completed   HPV VACCINES  Aged Out   Meningococcal B Vaccine  Aged Out    Health Maintenance  Health Maintenance Due  Topic Date Due   Hepatitis C Screening  Never done   Colonoscopy  Never done   DEXA SCAN  Never done   Medicare Annual Wellness (AWV)  09/03/2022   INFLUENZA VACCINE  12/23/2023   COVID-19 Vaccine (1 - 2024-25 season) Never done   Health Maintenance Items Addressed: DEXA ordered, See Nurse Notes at the end of this note  Additional Screening:  Vision Screening: Recommended annual ophthalmology exams for early detection of glaucoma and other disorders of the eye. Would you like a referral to an eye doctor? No    Dental Screening: Recommended annual dental exams for proper oral hygiene  Community Resource Referral / Chronic Care Management: CRR required this visit?  No   CCM required this visit?  No   Plan:    I have personally reviewed and noted the following in the patient's chart:   Medical and social history Use of alcohol, tobacco or illicit drugs  Current medications and supplements including opioid prescriptions. Patient is not currently taking opioid prescriptions. Functional ability and status Nutritional status Physical activity Advanced directives List of other physicians Hospitalizations, surgeries, and ER visits in previous 12 months Vitals Screenings to include cognitive, depression, and falls Referrals and appointments  In addition, I have reviewed and discussed with patient certain preventive protocols, quality metrics, and best practice recommendations. A written personalized  care plan for preventive services as well as general preventive health recommendations were provided to patient.   Rodarius Kichline L Candid Bovey, CMA   01/25/2024   After Visit Summary: (MyChart) Due to this being a telephonic visit, the after visit summary with patients personalized plan was offered to patient via MyChart   Notes: Patient is due for  a Hep C screening and can have that done during her next office visit here.  She declines Flu and Covid vaccines.  Patient is due for a DEXA and order has been placed today.  Patient stated that she is not due for a colonoscopy and will have records sent from [facility]  She had no other concerns to address today.

## 2024-01-25 NOTE — Patient Instructions (Signed)
 Jenny [name] , Thank you for taking time out of your busy schedule to complete your Annual Wellness Visit with me. I enjoyed our conversation and look forward to speaking with you again next year. I, as well as your care team,  appreciate your ongoing commitment to your health goals. Please review the following plan we discussed and let me know if I can assist you in the future. Your Game plan/ To Do List    Referrals: If you haven't heard from the office you've been referred to, please reach out to them at the phone provided.    DEXA scan ordered to evaluate the patient for osteoporosis.  To schedule an appointment, please call  [facility] [phone] [address] [place] [address]   Follow up Visits: We will see or speak with you next year for your Next Medicare AWV with our clinical staff Have you seen your provider in the last 6 months (3 months if uncontrolled diabetes)? Yes.  Last office visit on 09/01/2023. Next office visit on 03/14/2024.    Clinician Recommendations:  Aim for 30 minutes of exercise or brisk walking, 6-8 glasses of water, and 5 servings of fruits and vegetables each day. You are due for a Hep C screening and will done during your next office visit here.  Remember to get your colonoscopy records sent to [facility] Primary health care at [place], as soon as possible.        This is a list of the screenings recommended for you:  Health Maintenance  Topic Date Due   Hepatitis C Screening  Never done   Colon Cancer Screening  Never done   DEXA scan (bone density measurement)  Never done   Medicare Annual Wellness Visit  09/03/2022   Flu Shot  12/23/2023   COVID-19 Vaccine (1 - 2024-25 season) Never done   Mammogram  10/04/2025   DTaP/Tdap/Td vaccine (3 - Td or Tdap) 12/13/2028   Pneumococcal Vaccine for age over 14  Completed   Zoster (Shingles) Vaccine  Completed   HPV Vaccine  Aged Out   Meningitis B Vaccine   Aged Out    Advanced directives: (Declined) Advance directive discussed with you today. Even though you declined this today, please call our office should you change your mind, and we can give you the proper paperwork for you to fill out. Advance Care Planning is important because it:  [x]  Makes sure you receive the medical care that is consistent with your values, goals, and preferences  [x]  It provides guidance to your family and loved ones and reduces their decisional burden about whether or not they are making the right decisions based on your wishes.  Follow the link provided in your after visit summary or read over the paperwork we have mailed to you to help you started getting your Advance Directives in place. If you need assistance in completing these, please reach out to us  so that we can help you!  See attachments for Preventive Care and Fall Prevention Tips.

## 2024-01-31 NOTE — Progress Notes (Unsigned)
 [name] [address] [place] [address] Phone: [phone] Subjective:   Jenny [name], am serving as a scribe for Dr. Arthea Claudene.  I'm seeing this patient by the request  of:  Geofm Glade PARAS, MD  CC: Bilateral knee pain  YEP:Dlagzrupcz  11/02/2023 Discussed icing regimen and home exercises, continuing to stay active.  Patient has lost weight since we have seen her previously.  Encouraged her to continue to do so.  Down another 6 pounds.  Has responded well to the viscosupplementation some and hopefully will again.  Follow-up with me again in 3 months otherwise   Updated 02/01/2024 Jenny [name] is a 73 y.o. female coming in with complaint of B knee pain. Patient states that she had relief almost immediately after last visit but that lasted 2 days. Over past week her knee pain and swelling increased. States that she has ear infection that has not cleared up for past week. Knees are stiffer than usual.   Has been going to gym and she does not have pain with water aerobics. Also doing chair yoga at work.       Past Medical History:  Diagnosis Date   Hypertension    Past Surgical History:  Procedure Laterality Date   CHOLECYSTECTOMY     TONSILLECTOMY     Social History   Socioeconomic History   Marital status: Divorced    Spouse name: Not on file   Number of children: Not on file   Years of education: Not on file   Highest education level: Not on file  Occupational History   Occupation: Part time work/front desk  Tobacco Use   Smoking status: Never   Smokeless tobacco: Never  Vaping Use   Vaping status: Never Used  Substance and Sexual Activity   Alcohol use: No   Drug use: No   Sexual activity: Not on file  Other Topics Concern   Not on file  Social History Narrative   Lives alone and has 1 dog/2025   Social Drivers of Health   Financial Resource Strain: Low Risk  (01/25/2024)   Overall Financial Resource Strain  (CARDIA)    Difficulty of Paying Living Expenses: Not hard at all  Food Insecurity: No Food Insecurity (01/25/2024)   Hunger Vital Sign    Worried About Running Out of Food in the Last Year: Never true    Ran Out of Food in the Last Year: Never true  Transportation Needs: No Transportation Needs (01/25/2024)   PRAPARE - Administrator, Civil Service (Medical): No    Lack of Transportation (Non-Medical): No  Physical Activity: Insufficiently Active (01/25/2024)   Exercise Vital Sign    Days of Exercise per Week: 2 days    Minutes of Exercise per Session: 60 min  Stress: No Stress Concern Present (01/25/2024)   [employer] of Occupational Health - Occupational Stress Questionnaire    Feeling of Stress: Not at all  Social Connections: Moderately Integrated (01/25/2024)   Social Connection and Isolation Panel    Frequency of Communication with Friends and Family: More than three times a week    Frequency of Social Gatherings with Friends and Family: Twice a week    Attends Religious Services: More than 4 times per year    Active Member of [employer] or Organizations: Yes    Attends Banker Meetings: More than 4 times per year    Marital Status: Divorced   Allergies  Allergen  Reactions   Tape Anaphylaxis and Rash    CLOTH TAPE IS OK (heart monitor electrodes/holter monitor  caused throat swelling) , Paper tape OK to use   Demerol Itching   Latex Rash   Meperidine Itching   Wound Dressing Adhesive    No family history on file.  Current Outpatient Medications (Endocrine & Metabolic):    levothyroxine (SYNTHROID, LEVOTHROID) 50 MCG tablet, Take by mouth.  Current Outpatient Medications (Cardiovascular):    valsartan (DIOVAN) 80 MG tablet, Take 80 mg by mouth.     Current Outpatient Medications (Other):    Diclofenac  Sodium (PENNSAID ) 2 % SOLN, Place 2 application onto the skin 2 (two) times daily.   DULoxetine  (CYMBALTA ) 60 MG capsule, Take 1 capsule (60 mg  total) by mouth daily.   magnesium 30 MG tablet, Take 30 mg by mouth daily.   Potassium Chloride CR (MICRO-K) 8 MEQ CPCR capsule CR, Take 24 mEq by mouth.   temazepam (RESTORIL) 15 MG capsule, Take 15 mg by mouth at bedtime as needed for sleep.   Reviewed prior external information including notes and imaging from  primary care provider As well as notes that were available from care everywhere and other healthcare systems.  Past medical history, social, surgical and family history all reviewed in electronic medical record.  No pertanent information unless stated regarding to the chief complaint.   Review of Systems:  No headache, visual changes, nausea, vomiting, diarrhea, constipation, dizziness, abdominal pain, skin rash, fevers, chills, night sweats, weight loss, swollen lymph nodes, body aches, joint swelling, chest pain, shortness of breath, mood changes. POSITIVE muscle aches  Objective  Blood pressure 128/76, pulse 82, height 5' 2 (1.575 m), SpO2 97%.   General: No apparent distress alert and oriented x3 mood and affect normal, dressed appropriately.  HEENT: Pupils equal, extraocular movements intact  Respiratory: Patient's speak in full sentences and does not appear short of breath  Cardiovascular: Trace edema noted to the legs bilaterally but symmetric  Bilateral knees do have significant arthritic changes noted.  No crepitus noted.  Some instability with valgus and varus force.  After informed written and verbal consent, patient was seated on exam table. Right knee was prepped with alcohol swab and utilizing anteromedial approach, patient's right knee space was injected with 4:1  marcaine 0.5%: Kenalog  40mg /dL. Patient tolerated the procedure well without immediate complications.  After informed written and verbal consent, patient was seated on exam table. Left knee was prepped with alcohol swab and utilizing anterior medial approach, patient's left knee space was injected with  4:1  marcaine 0.5%: Kenalog  40mg /dL. Patient tolerated the procedure well without immediate complications.   Impression and Recommendations:    The above documentation has been reviewed and is accurate and complete Jenny [name] M Aunisty Reali, DO

## 2024-02-01 ENCOUNTER — Ambulatory Visit: Admitting: Family Medicine

## 2024-02-01 ENCOUNTER — Encounter: Payer: Self-pay | Admitting: Family Medicine

## 2024-02-01 VITALS — BP 128/76 | HR 82 | Ht 62.0 in

## 2024-02-01 DIAGNOSIS — M170 Bilateral primary osteoarthritis of knee: Secondary | ICD-10-CM | POA: Diagnosis not present

## 2024-02-01 NOTE — Assessment & Plan Note (Signed)
 Chronic problem with worsening symptoms.  Patient is still attempting to lose weight so she would be a surgical candidate potentially.  Discussed with patient about icing regimen and home exercises, discussed with patient about topical anti-inflammatories.  Will try a different approach for maybe a longer duration of improvement.  Follow-up with me again in 10 weeks

## 2024-02-01 NOTE — Patient Instructions (Signed)
 Thanks for the surprise! See me again in 10 weeks

## 2024-02-07 ENCOUNTER — Inpatient Hospital Stay: Admission: RE | Admit: 2024-02-07 | Source: Ambulatory Visit

## 2024-02-14 ENCOUNTER — Ambulatory Visit (INDEPENDENT_AMBULATORY_CARE_PROVIDER_SITE_OTHER)
Admission: RE | Admit: 2024-02-14 | Discharge: 2024-02-14 | Disposition: A | Discharge status: Home / Self Care | Source: Ambulatory Visit | Attending: Internal Medicine

## 2024-02-14 DIAGNOSIS — Z780 Asymptomatic menopausal state: Secondary | ICD-10-CM

## 2024-02-19 ENCOUNTER — Ambulatory Visit: Payer: Self-pay | Admitting: Internal Medicine

## 2024-02-19 DIAGNOSIS — M85851 Other specified disorders of bone density and structure, right thigh: Secondary | ICD-10-CM

## 2024-02-19 DIAGNOSIS — M8580 Other specified disorders of bone density and structure, unspecified site: Secondary | ICD-10-CM | POA: Insufficient documentation

## 2024-03-13 NOTE — Patient Instructions (Addendum)

## 2024-03-13 NOTE — Progress Notes (Unsigned)
 Subjective:    Patient ID: Jenny [name], female    DOB: 1951-04-04, 73 y.o.   MRN: [id number]      HPI Jenny [name] is here for a Physical exam and her chronic medical problems.   Has been w/o water since the end of July - having a well put in.   Has had some increased stress and occasionally feels an extra beat or a few beats/palpitations.  A couple of times it makes her feel weak - not always.    The other day she had a throbbing sensation in her right upper neck - it was a little swollen - better than the other day.      Medications and allergies reviewed with patient and updated if appropriate.  Current Outpatient Medications on File Prior to Visit  Medication Sig Dispense Refill   Diclofenac  Sodium (PENNSAID ) 2 % SOLN Place 2 application onto the skin 2 (two) times daily. 112 g 3   DULoxetine  (CYMBALTA ) 60 MG capsule Take 1 capsule (60 mg total) by mouth daily. 90 capsule 1   levothyroxine (SYNTHROID, LEVOTHROID) 50 MCG tablet Take by mouth.     magnesium 30 MG tablet Take 30 mg by mouth daily.     Potassium Chloride CR (MICRO-K) 8 MEQ CPCR capsule CR Take 24 mEq by mouth.     temazepam (RESTORIL) 15 MG capsule Take 15 mg by mouth at bedtime as needed for sleep.     valsartan (DIOVAN) 80 MG tablet Take 80 mg by mouth.     No current facility-administered medications on file prior to visit.    Review of Systems  Constitutional:  Negative for fever.  Eyes:  Negative for visual disturbance.  Respiratory:  Negative for cough, shortness of breath and wheezing.   Cardiovascular:  Positive for palpitations and leg swelling (occasionally- ankles). Negative for chest pain.  Gastrointestinal:  Negative for abdominal pain, blood in stool, constipation and diarrhea.       No gerd  Genitourinary:  Negative for dysuria.  Musculoskeletal:  Positive for arthralgias (thumbs, knees). Negative for back pain.  Skin:  Negative for rash.  Neurological:  Positive for light-headedness (if  gets up fast) and headaches (daily x few weeks - ? related to ear - on right side of forehead/face).  Psychiatric/Behavioral:  Positive for dysphoric mood and sleep disturbance (gets to sleep easily - once wakes up - difficulty falling back asleep). The patient is not nervous/anxious.        Objective:   Vitals:   03/14/24 1321  BP: 124/80  Pulse: 67  Temp: 98.8 F (37.1 C)  SpO2: 97%   Filed Weights   03/14/24 1321  Weight: 255 lb (115.7 kg)   Body mass index is 46.64 kg/m.  BP Readings from Last 3 Encounters:  03/14/24 124/80  02/01/24 128/76  11/02/23 116/80    Wt Readings from Last 3 Encounters:  03/14/24 255 lb (115.7 kg)  01/25/24 262 lb (118.8 kg)  11/02/23 262 lb (118.8 kg)       Physical Exam Constitutional: She appears well-developed and well-nourished. No distress.  HENT:  Head: Normocephalic and atraumatic.  Right Ear: External ear normal. Normal ear canal and TM Left Ear: External ear normal.  Normal ear canal and TM Mouth/Throat: Oropharynx is clear and moist.  Eyes: Conjunctivae normal.  Neck: Neck supple. No tracheal deviation present. No thyromegaly present. Slight swelling in right parotid gland No carotid bruit  Cardiovascular: Normal rate, regular rhythm and normal heart  sounds.   No murmur heard.  No edema. Pulmonary/Chest: Effort normal and breath sounds normal. No respiratory distress. She has no wheezes. She has no rales.  Breast: deferred   Abdominal: Soft. She exhibits no distension. There is no tenderness.  Lymphadenopathy: She has no cervical adenopathy.  Skin: Skin is warm and dry. She is not diaphoretic.  Psychiatric: She has a normal mood and affect. Her behavior is normal.     Lab Results  Component Value Date   WBC 5.3 09/01/2023   HGB 12.7 09/01/2023   HCT 40.0 09/01/2023   PLT 258.0 09/01/2023   GLUCOSE 96 09/01/2023   CHOL 221 (H) 09/01/2023   TRIG 106.0 09/01/2023   HDL 67.10 09/01/2023   LDLCALC 133 (H)  09/01/2023   ALT 14 09/01/2023   AST 21 09/01/2023   NA 139 09/01/2023   K 4.3 09/01/2023   CL 103 09/01/2023   CREATININE 1.02 09/01/2023   BUN 19 09/01/2023   CO2 29 09/01/2023   TSH 1.49 09/01/2023   HGBA1C 5.9 09/01/2023         Assessment & Plan:   Physical exam: Screening blood work  ordered Exercise  minimal walking, does water aerobics  Weight  obese-has lost some weight since her last visit Substance abuse  none   Reviewed recommended immunizations.   Health Maintenance  Topic Date Due   Hepatitis C Screening  Never done   Colonoscopy  Never done   Influenza Vaccine  12/23/2023   COVID-19 Vaccine (1 - 2025-26 season) Never done   Medicare Annual Wellness (AWV)  01/24/2025   Mammogram  10/04/2025   DEXA SCAN  02/13/2026   DTaP/Tdap/Td (3 - Td or Tdap) 12/13/2028   Pneumococcal Vaccine: 50+ Years  Completed   Zoster Vaccines- Shingrix  Completed   Meningococcal B Vaccine  Aged Out          See Problem List for Assessment and Plan of chronic medical problems.

## 2024-03-14 ENCOUNTER — Ambulatory Visit (INDEPENDENT_AMBULATORY_CARE_PROVIDER_SITE_OTHER): Admitting: Internal Medicine

## 2024-03-14 ENCOUNTER — Ambulatory Visit: Payer: Self-pay | Admitting: Internal Medicine

## 2024-03-14 VITALS — BP 124/80 | HR 67 | Temp 98.8°F | Ht 62.0 in | Wt 255.0 lb

## 2024-03-14 DIAGNOSIS — Z0000 Encounter for general adult medical examination without abnormal findings: Secondary | ICD-10-CM | POA: Diagnosis not present

## 2024-03-14 DIAGNOSIS — G479 Sleep disorder, unspecified: Secondary | ICD-10-CM

## 2024-03-14 DIAGNOSIS — M85851 Other specified disorders of bone density and structure, right thigh: Secondary | ICD-10-CM | POA: Diagnosis not present

## 2024-03-14 DIAGNOSIS — N1831 Chronic kidney disease, stage 3a: Secondary | ICD-10-CM

## 2024-03-14 DIAGNOSIS — E782 Mixed hyperlipidemia: Secondary | ICD-10-CM | POA: Diagnosis not present

## 2024-03-14 DIAGNOSIS — K118 Other diseases of salivary glands: Secondary | ICD-10-CM | POA: Insufficient documentation

## 2024-03-14 DIAGNOSIS — E559 Vitamin D deficiency, unspecified: Secondary | ICD-10-CM

## 2024-03-14 DIAGNOSIS — I10 Essential (primary) hypertension: Secondary | ICD-10-CM | POA: Diagnosis not present

## 2024-03-14 DIAGNOSIS — F419 Anxiety disorder, unspecified: Secondary | ICD-10-CM

## 2024-03-14 DIAGNOSIS — F3289 Other specified depressive episodes: Secondary | ICD-10-CM

## 2024-03-14 DIAGNOSIS — E039 Hypothyroidism, unspecified: Secondary | ICD-10-CM

## 2024-03-14 DIAGNOSIS — R7303 Prediabetes: Secondary | ICD-10-CM

## 2024-03-14 DIAGNOSIS — E538 Deficiency of other specified B group vitamins: Secondary | ICD-10-CM | POA: Diagnosis not present

## 2024-03-14 LAB — LIPID PANEL
Cholesterol: 238 mg/dL — ABNORMAL HIGH (ref 0–200)
HDL: 66.4 mg/dL (ref >39.00)
LDL Cholesterol: 150 mg/dL — ABNORMAL HIGH (ref 0–99)
NonHDL: 171.83
Total CHOL/HDL Ratio: 4
Triglycerides: 108 mg/dL (ref 0.0–149.0)
VLDL: 21.6 mg/dL (ref 0.0–40.0)

## 2024-03-14 LAB — CBC
HCT: 39.2 % (ref 36.0–46.0)
Hemoglobin: 12.7 g/dL (ref 12.0–15.0)
MCHC: 32.4 g/dL (ref 30.0–36.0)
MCV: 78.4 fl (ref 78.0–100.0)
Platelets: 251 K/uL (ref 150.0–400.0)
RBC: 5 Mil/uL (ref 3.87–5.11)
RDW: 17.4 % — ABNORMAL HIGH (ref 11.5–15.5)
WBC: 5.4 K/uL (ref 4.0–10.5)

## 2024-03-14 LAB — COMPREHENSIVE METABOLIC PANEL WITH GFR
ALT: 14 U/L (ref 0–35)
AST: 22 U/L (ref 0–37)
Albumin: 4.2 g/dL (ref 3.5–5.2)
Alkaline Phosphatase: 82 U/L (ref 39–117)
BUN: 16 mg/dL (ref 6–23)
CO2: 31 meq/L (ref 19–32)
Calcium: 9.4 mg/dL (ref 8.4–10.5)
Chloride: 103 meq/L (ref 96–112)
Creatinine, Ser: 0.97 mg/dL (ref 0.40–1.20)
GFR: 59.06 mL/min — ABNORMAL LOW (ref >60.00)
Glucose, Bld: 86 mg/dL (ref 70–99)
Potassium: 4.4 meq/L (ref 3.5–5.1)
Sodium: 140 meq/L (ref 135–145)
Total Bilirubin: 0.6 mg/dL (ref 0.2–1.2)
Total Protein: 6.6 g/dL (ref 6.0–8.3)

## 2024-03-14 LAB — HEMOGLOBIN A1C: Hgb A1c MFr Bld: 5.9 % (ref 4.6–6.5)

## 2024-03-14 LAB — VITAMIN D 25 HYDROXY (VIT D DEFICIENCY, FRACTURES): VITD: 29.91 ng/mL — ABNORMAL LOW (ref 30.00–100.00)

## 2024-03-14 LAB — TSH: TSH: 1.5 u[IU]/mL (ref 0.35–5.50)

## 2024-03-14 LAB — VITAMIN B12: Vitamin B-12: 193 pg/mL — ABNORMAL LOW (ref 211–911)

## 2024-03-14 NOTE — Assessment & Plan Note (Addendum)
 Chronic Slightly increased recently because of not having any water She has been taking 2 Cymbalta  daily which is what her previous PCP recommended and that has helped She just went back to taking 1 pill daily Controlled, stable Continue cymbalta  60 mg daily

## 2024-03-14 NOTE — Assessment & Plan Note (Signed)
 Chronic BP well controlled Continue valsartan 80 mg daily Cmp, cbc

## 2024-03-14 NOTE — Assessment & Plan Note (Signed)
 Chronic Lab Results  Component Value Date   HGBA1C 5.9 09/01/2023   Check a1c Low sugar / carb diet Stressed regular exercise Work on weight loss

## 2024-03-14 NOTE — Assessment & Plan Note (Signed)
Chronic Controlled, stable Continue cymbalta 60 mg daily  

## 2024-03-14 NOTE — Assessment & Plan Note (Addendum)
 Chronic Stage 3a, mild and stable Cbc, cmp

## 2024-03-14 NOTE — Assessment & Plan Note (Signed)
 Chronic Check lipids Not currently on any medication Regular exercise and healthy diet encouraged

## 2024-03-14 NOTE — Assessment & Plan Note (Addendum)
 Chronic Taking vitamin d daily Check vitamin d level

## 2024-03-14 NOTE — Assessment & Plan Note (Addendum)
 Chronic BMI 46.64 with co-morbidities hypertension, hyperlipidemia Stressed regular exercise-she is doing water aerobics and is limited in how much walking she can do because of her knee arthritis-encourage as much she is able to do Healthy diet, smaller portions

## 2024-03-14 NOTE — Assessment & Plan Note (Signed)
 Chronic  Euthyroid Check tsh and will titrate med dose if needed Currently taking levothyroxine 50 mcg daily

## 2024-03-14 NOTE — Assessment & Plan Note (Addendum)
 Chronic Dexa up to date-reviewed DEXA-Will repeat in 2 years Stressed regular exercise Advised daily calcium and vitamin d  Check vitamin d  level

## 2024-03-14 NOTE — Assessment & Plan Note (Signed)
 Chronic Intermittent Takes restoril 15mg  prn only -- continue using it as needed

## 2024-03-14 NOTE — Assessment & Plan Note (Signed)
 New Here today she had some fullness and tenderness just under the angle of the jaw.  There was some radiating pain.  This has improved.  She thinks the area was slightly swollen at the time.  The area is still slightly tender Mild fullness of parotid gland on exam-possible obstruction No evidence of infection Advised warm compresses, massage, drinking plenty of fluids If symptoms do not completely resolve, recur or worsen she will let me know and I will order imaging

## 2024-03-14 NOTE — Assessment & Plan Note (Addendum)
 Chronic Taking B12 1000 mcg daily Check B12 level

## 2024-04-03 NOTE — Progress Notes (Unsigned)
 [name] [address] [place] [address] Phone: [phone] Subjective:   Jenny [name], am serving as a scribe for Dr. Arthea Claudene.  I'm seeing this patient by the request  of:  Geofm Glade PARAS, MD  CC: Bilateral knee pain follow-up  YEP:Dlagzrupcz  02/01/2024 Chronic problem with worsening symptoms.  Patient is still attempting to lose weight so she would be a surgical candidate potentially.  Discussed with patient about icing regimen and home exercises, discussed with patient about topical anti-inflammatories.  Will try a different approach for maybe a longer duration of improvement.  Follow-up with me again in 10 weeks     Updated 04/04/2024 Jenny [name] is a 73 y.o. female coming in with complaint of B knee pain. Knees have been doing well and started hurting about 2 days ago. Previous gel injection in June did last a while, just the first 2 days felt full. Too early for gel injections, but will re run.       Past Medical History:  Diagnosis Date   Hypertension    Past Surgical History:  Procedure Laterality Date   CHOLECYSTECTOMY     TONSILLECTOMY     Social History   Socioeconomic History   Marital status: Divorced    Spouse name: Not on file   Number of children: Not on file   Years of education: Not on file   Highest education level: Associate degree: occupational, scientist, product/process development, or vocational program  Occupational History   Occupation: Part time work/front desk  Tobacco Use   Smoking status: Never   Smokeless tobacco: Never  Vaping Use   Vaping status: Never Used  Substance and Sexual Activity   Alcohol use: No   Drug use: No   Sexual activity: Not on file  Other Topics Concern   Not on file  Social History Narrative   Lives alone and has 1 dog/2025   Social Drivers of Health   Financial Resource Strain: Low Risk  (03/13/2024)   Overall Financial Resource Strain (CARDIA)    Difficulty of Paying Living  Expenses: Not hard at all  Food Insecurity: No Food Insecurity (03/13/2024)   Hunger Vital Sign    Worried About Running Out of Food in the Last Year: Never true    Ran Out of Food in the Last Year: Never true  Transportation Needs: No Transportation Needs (03/13/2024)   PRAPARE - Administrator, Civil Service (Medical): No    Lack of Transportation (Non-Medical): No  Physical Activity: Insufficiently Active (03/13/2024)   Exercise Vital Sign    Days of Exercise per Week: 3 days    Minutes of Exercise per Session: 30 min  Stress: No Stress Concern Present (03/13/2024)   [employer] of Occupational Health - Occupational Stress Questionnaire    Feeling of Stress: Only a little  Social Connections: Moderately Integrated (03/13/2024)   Social Connection and Isolation Panel    Frequency of Communication with Friends and Family: More than three times a week    Frequency of Social Gatherings with Friends and Family: Once a week    Attends Religious Services: More than 4 times per year    Active Member of [employer] or Organizations: Yes    Attends Engineer, Structural: More than 4 times per year    Marital Status: Divorced   Allergies  Allergen Reactions   Tape Anaphylaxis and Rash    CLOTH TAPE IS OK (heart monitor electrodes/holter monitor  caused throat swelling) , Paper tape OK to use   Demerol Itching   Latex Rash   Meperidine Itching   Wound Dressing Adhesive    No family history on file.  Current Outpatient Medications (Endocrine & Metabolic):    levothyroxine (SYNTHROID, LEVOTHROID) 50 MCG tablet, Take by mouth.  Current Outpatient Medications (Cardiovascular):    valsartan (DIOVAN) 80 MG tablet, Take 80 mg by mouth.    Current Outpatient Medications (Hematological):    cyanocobalamin  (VITAMIN B12) 1000 MCG tablet, Take 1,000 mcg by mouth daily.  Current Outpatient Medications (Other):    cholecalciferol (VITAMIN D3) 25 MCG (1000 UNIT) tablet,  Take 1,000 Units by mouth daily.   Diclofenac  Sodium (PENNSAID ) 2 % SOLN, Place 2 application onto the skin 2 (two) times daily.   DULoxetine  (CYMBALTA ) 60 MG capsule, Take 1 capsule (60 mg total) by mouth daily.   magnesium 30 MG tablet, Take 30 mg by mouth daily.   Potassium Chloride CR (MICRO-K) 8 MEQ CPCR capsule CR, Take 24 mEq by mouth.   temazepam (RESTORIL) 15 MG capsule, Take 15 mg by mouth at bedtime as needed for sleep.   Reviewed prior external information including notes and imaging from  primary care provider As well as notes that were available from care everywhere and other healthcare systems.  Past medical history, social, surgical and family history all reviewed in electronic medical record.  No pertanent information unless stated regarding to the chief complaint.   Review of Systems:  No headache, visual changes, nausea, vomiting, diarrhea, constipation, dizziness, abdominal pain, skin rash, fevers, chills, night sweats, weight loss, swollen lymph nodes, body aches, joint swelling, chest pain, shortness of breath, mood changes. POSITIVE muscle aches  Objective  Blood pressure 118/66, pulse 93, height 5' 2 (1.575 m), weight 262 lb (118.8 kg), SpO2 96%.   General: No apparent distress alert and oriented x3 mood and affect normal, dressed appropriately.  HEENT: Pupils equal, extraocular movements intact  Respiratory: Patient's speak in full sentences and does not appear short of breath   Antalgic gait noted arthritic changes of the knees noted.  Severe on both sides.  Trace effusion on both sides.   After informed written and verbal consent, patient was seated on exam table. Right knee was prepped with alcohol swab and utilizing anterolateral approach, patient's right knee space was injected with 4:1  marcaine 0.5%: Kenalog  40mg /dL. Patient tolerated the procedure well without immediate complications.  After informed written and verbal consent, patient was seated on exam  table. Left knee was prepped with alcohol swab and utilizing anterolateral approach, patient's left knee space was injected with 4:1  marcaine 0.5%: Kenalog  40mg /dL. Patient tolerated the procedure well without immediate complications.   Impression and Recommendations:    The above documentation has been reviewed and is accurate and complete Ileta Ofarrell M Aretha Levi, DO

## 2024-04-04 ENCOUNTER — Ambulatory Visit: Admitting: Family Medicine

## 2024-04-04 ENCOUNTER — Encounter: Payer: Self-pay | Admitting: Family Medicine

## 2024-04-04 VITALS — BP 118/66 | HR 93 | Ht 62.0 in | Wt 262.0 lb

## 2024-04-04 DIAGNOSIS — M170 Bilateral primary osteoarthritis of knee: Secondary | ICD-10-CM | POA: Diagnosis not present

## 2024-04-04 NOTE — Patient Instructions (Addendum)
 Injection in knees Good to see you! Welcome to the 20th century See you again in 2-3 months

## 2024-04-04 NOTE — Assessment & Plan Note (Addendum)
 Chronic problem with worsening symptoms.  The patient has had and severe knee arthritis for over the last 9 years but has responded well to the injections.  Discussed icing regimen and home exercises, discussed which activities to do and which ones to avoid.  Increase activity slowly.  Discussed icing regimen.  Increase activity slowly.  Patient has responded well to viscosupplementation previously and we will try that again.  Follow-up again in 6 to 8 weeks otherwise.

## 2024-04-17 ENCOUNTER — Ambulatory Visit: Admitting: Family Medicine

## 2024-04-20 ENCOUNTER — Other Ambulatory Visit: Payer: Self-pay | Admitting: Internal Medicine

## 2024-05-30 ENCOUNTER — Telehealth: Payer: Self-pay | Admitting: Family Medicine

## 2024-05-30 NOTE — Telephone Encounter (Signed)
 Pt has appt with [facility] 06/05/24. New ins is [employer], in [place] and she is uploading card copy.  Please run for GEL Bil Knees.

## 2024-05-30 NOTE — Telephone Encounter (Signed)
 [employer] benefits ran for bilateral knee Case ID [id number]

## 2024-05-31 NOTE — Telephone Encounter (Signed)
 noted

## 2024-05-31 NOTE — Telephone Encounter (Signed)
 Scheduled 06/05/2024  DUROLANE authorized bilateral knee PRE CERT REQUIRED Patient responsible for 20% coinsurance Copay $20 Deductible does not apply OOP MAX $3600 has met $0 Once OOP has been met coverage 100% and copay will no longer apply AUTHORIZATION # [id number] 05/31/24-09/28/24

## 2024-06-04 NOTE — Progress Notes (Unsigned)
 " [name] [address] [place] [address] Phone: [phone] Subjective:   Jenny [name], am serving as a scribe for Dr. Arthea Claudene.  I'm seeing this patient by the request  of:  Jenny Glade PARAS, MD  CC: Bilateral knee pain  YEP:Dlagzrupcz  Jenny [name] is a 74 y.o. female coming in with complaint of B knee pain. Steroid injection on 04/04/2024. Approved for Durolane today. Patient states that her knees are doing good. On Christmas day she had some pain inferior to the patella. When bearing weight on that leg she would have sharp pain. Pain subsided the following day.       Past Medical History:  Diagnosis Date   Hypertension    Past Surgical History:  Procedure Laterality Date   CHOLECYSTECTOMY     TONSILLECTOMY     Social History   Socioeconomic History   Marital status: Divorced    Spouse name: Not on file   Number of children: Not on file   Years of education: Not on file   Highest education level: Associate degree: occupational, scientist, product/process development, or vocational program  Occupational History   Occupation: Part time work/front desk  Tobacco Use   Smoking status: Never   Smokeless tobacco: Never  Vaping Use   Vaping status: Never Used  Substance and Sexual Activity   Alcohol use: No   Drug use: No   Sexual activity: Not on file  Other Topics Concern   Not on file  Social History Narrative   Lives alone and has 1 dog/2025   Social Drivers of Health   Tobacco Use: Low Risk (04/04/2024)   Patient History    Smoking Tobacco Use: Never    Smokeless Tobacco Use: Never    Passive Exposure: Not on file  Financial Resource Strain: Low Risk (03/13/2024)   Overall Financial Resource Strain (CARDIA)    Difficulty of Paying Living Expenses: Not hard at all  Food Insecurity: No Food Insecurity (03/13/2024)   Epic    Worried About Radiation Protection Practitioner of Food in the Last Year: Never true    Ran Out of Food in the Last Year: Never true   Transportation Needs: No Transportation Needs (03/13/2024)   Epic    Lack of Transportation (Medical): No    Lack of Transportation (Non-Medical): No  Physical Activity: Insufficiently Active (03/13/2024)   Exercise Vital Sign    Days of Exercise per Week: 3 days    Minutes of Exercise per Session: 30 min  Stress: No Stress Concern Present (03/13/2024)   [employer] of Occupational Health - Occupational Stress Questionnaire    Feeling of Stress: Only a little  Social Connections: Moderately Integrated (03/13/2024)   Social Connection and Isolation Panel    Frequency of Communication with Friends and Family: More than three times a week    Frequency of Social Gatherings with Friends and Family: Once a week    Attends Religious Services: More than 4 times per year    Active Member of Clubs or Organizations: Yes    Attends Banker Meetings: More than 4 times per year    Marital Status: Divorced  Depression (PHQ2-9): Low Risk (01/25/2024)   Depression (PHQ2-9)    PHQ-2 Score: 2  Alcohol Screen: Low Risk (01/25/2024)   Alcohol Screen    Last Alcohol Screening Score (AUDIT): 0  Housing: Low Risk (03/13/2024)   Epic    Unable to Pay for Housing in the Last Year: No  Number of Times Moved in the Last Year: 0    Homeless in the Last Year: No  Utilities: Not At Risk (01/25/2024)   Epic    Threatened with loss of utilities: No  Health Literacy: Adequate Health Literacy (01/25/2024)   B1300 Health Literacy    Frequency of need for help with medical instructions: Never   Allergies[1] No family history on file.  Current Outpatient Medications (Endocrine & Metabolic):    levothyroxine (SYNTHROID, LEVOTHROID) 50 MCG tablet, Take by mouth.  Current Outpatient Medications (Cardiovascular):    valsartan (DIOVAN) 80 MG tablet, Take 80 mg by mouth.  Current Outpatient Medications (Hematological):    cyanocobalamin  (VITAMIN B12) 1000 MCG tablet, Take 1,000 mcg by mouth  daily.  Current Outpatient Medications (Other):    cholecalciferol (VITAMIN D3) 25 MCG (1000 UNIT) tablet, Take 1,000 Units by mouth daily.   Diclofenac  Sodium (PENNSAID ) 2 % SOLN, Place 2 application onto the skin 2 (two) times daily.   DULoxetine  (CYMBALTA ) 60 MG capsule, TAKE 1 CAPSULE BY MOUTH EVERY DAY   magnesium 30 MG tablet, Take 30 mg by mouth daily.   Potassium Chloride CR (MICRO-K) 8 MEQ CPCR capsule CR, Take 24 mEq by mouth.   temazepam (RESTORIL) 15 MG capsule, Take 15 mg by mouth at bedtime as needed for sleep.   Reviewed prior external information including notes and imaging from  primary care provider As well as notes that were available from care everywhere and other healthcare systems.  Past medical history, social, surgical and family history all reviewed in electronic medical record.  No pertanent information unless stated regarding to the chief complaint.   Review of Systems:  No headache, visual changes, nausea, vomiting, diarrhea, constipation, dizziness, abdominal pain, skin rash, fevers, chills, night sweats, weight loss, swollen lymph nodes, body aches, joint swelling, chest pain, shortness of breath, mood changes. POSITIVE muscle aches  Objective  Blood pressure 120/76, pulse 65, height 5' 2 (1.575 m), weight 259 lb (117.5 kg), SpO2 100%.   General: No apparent distress alert and oriented x3 mood and affect normal, dressed appropriately.  HEENT: Pupils equal, extraocular movements intact  Respiratory: Patient's speak in full sentences and does not appear short of breath  Antalgic gait noted.  Patient does have instability of the knees bilaterally.  Lacks more than 100 degrees of flexion.  After informed written and verbal consent, patient was seated on exam table. Right knee was prepped with alcohol swab and utilizing anterolateral approach, patient's right knee space was injected with 60 mg per 3 mL of Durolane (sodium hyaluronate) in a prefilled syringe was  injected easily into the knee through a 22-gauge needle..Patient tolerated the procedure well without immediate complications.  After informed written and verbal consent, patient was seated on exam table. Left knee was prepped with alcohol swab and utilizing anterolateral approach, patient's left knee space was injected with 60 mg per 3 mL of Durolane (sodium hyaluronate) in a prefilled syringe was injected easily into the knee through a 22-gauge needle..Patient tolerated the procedure well without immediate complications.    Impression and Recommendations:    The above documentation has been reviewed and is accurate and complete Jenny CHRISTELLA Sharps, DO        [1]  Allergies Allergen Reactions   Tape Anaphylaxis and Rash    CLOTH TAPE IS OK (heart monitor electrodes/holter monitor  caused throat swelling) , Paper tape OK to use   Demerol Itching   Latex Rash   Meperidine Itching  Wound Dressing Adhesive    "

## 2024-06-05 ENCOUNTER — Encounter: Payer: Self-pay | Admitting: Family Medicine

## 2024-06-05 ENCOUNTER — Ambulatory Visit (INDEPENDENT_AMBULATORY_CARE_PROVIDER_SITE_OTHER): Admitting: Family Medicine

## 2024-06-05 VITALS — BP 120/76 | HR 65 | Ht 62.0 in | Wt 259.0 lb

## 2024-06-05 DIAGNOSIS — M170 Bilateral primary osteoarthritis of knee: Secondary | ICD-10-CM

## 2024-06-05 MED ORDER — SODIUM HYALURONATE 60 MG/3ML IX PRSY
120.0000 mg | PREFILLED_SYRINGE | Freq: Once | INTRA_ARTICULAR | Status: AC
  Administered 2024-06-05: 120 mg via INTRA_ARTICULAR

## 2024-06-05 NOTE — Assessment & Plan Note (Signed)
 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  Trying to avoid any surgical intervention.  BMI is still over 40 so patient would not be considered a surgical candidate at this moment.  Patient is activity.  given viscosupplementation today.  follow-up again in 6 to 12 weeks

## 2024-06-05 NOTE — Patient Instructions (Addendum)
 Durolane injections today Enjoy the cruise [facility] and [place] See me again in 10-12 weeks

## 2024-06-11 ENCOUNTER — Telehealth: Payer: Self-pay | Admitting: Family Medicine

## 2024-06-11 NOTE — Telephone Encounter (Signed)
 Patient called and got gel injections but her knees are hurting so bad she can hardly stand it or put weight on it. Before she got home that day her left knee was hurting and she put heat on it to see if that would help and now since Friday her right knee is hurting really bad. She has pain all the time. Sometimes she walks and it gives way on her. She was using a cane all day yesterday and she has never had to do that. She would like to know if this normal. Please advise.

## 2024-06-12 NOTE — Telephone Encounter (Signed)
 Spoke with patient. She has had a hard time walking and putting weight on the L knee. Both knees are sore though. Made appointment for patient tomorrow, 06/13/2024, at 12:45pm.

## 2024-06-12 NOTE — Progress Notes (Unsigned)
 " [facility] 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  Trying to avoid any surgical intervention.  BMI is still over 40 so patient would not be considered a surgical candidate at this moment.  Patient is activity.  given viscosupplementation today.  follow-up again in 6 to 12 weeks     Updated 06/13/2024 Jenny [name] is a 74 y.o. female coming in with complaint of knee pain after gel injection.  Had significant difficulty even with daily activities including walking.  This was over the weekend.  Patient states is better today. Constant pain for 2 days. Knee gave way on Sunday R knee. Weight on the knee very painful. Sharp pains with pressure. Pain on the medial side the worse.       Past Medical History:  Diagnosis Date   Hypertension    Past Surgical History:  Procedure Laterality Date   CHOLECYSTECTOMY     TONSILLECTOMY     Social History   Socioeconomic History   Marital status: Divorced    Spouse name: Not on file   Number of children: Not on file   Years of education: Not on file   Highest education level: Associate degree: occupational, scientist, product/process development, or vocational program  Occupational History   Occupation: Part time work/front desk  Tobacco Use   Smoking status: Never   Smokeless tobacco: Never  Vaping Use   Vaping status: Never Used  Substance and Sexual Activity   Alcohol use: No   Drug use: No   Sexual activity: Not on file  Other Topics Concern   Not on file  Social History Narrative   Lives alone and has 1 dog/2025   Social Drivers of Health   Tobacco Use: Low Risk (06/05/2024)   Patient History    Smoking Tobacco Use: Never    Smokeless Tobacco Use: Never    Passive Exposure: Not on  file  Financial Resource Strain: Low Risk (03/13/2024)   Overall Financial Resource Strain (CARDIA)    Difficulty of Paying Living Expenses: Not hard at all  Food Insecurity: No Food Insecurity (03/13/2024)   Epic    Worried About Radiation Protection Practitioner of Food in the Last Year: Never true    Ran Out of Food in the Last Year: Never true  Transportation Needs: No Transportation Needs (03/13/2024)   Epic    Lack of Transportation (Medical): No    Lack of Transportation (Non-Medical): No  Physical Activity: Insufficiently Active (03/13/2024)   Exercise Vital Sign    Days of Exercise per Week: 3 days    Minutes of Exercise per Session: 30 min  Stress: No Stress Concern Present (03/13/2024)   [employer] of Occupational Health - Occupational Stress Questionnaire    Feeling of Stress: Only a little  Social Connections: Moderately Integrated (03/13/2024)   Social Connection and Isolation Panel    Frequency of Communication with Friends and Family: More than three times a week    Frequency of Social Gatherings with Friends and Family: Once a week    Attends Religious Services: More than 4 times per year    Active Member of [employer] or Organizations: Yes    Attends Banker Meetings: More than 4 times per year    Marital Status:  Divorced  Depression (PHQ2-9): Low Risk (01/25/2024)   Depression (PHQ2-9)    PHQ-2 Score: 2  Alcohol Screen: Low Risk (01/25/2024)   Alcohol Screen    Last Alcohol Screening Score (AUDIT): 0  Housing: Low Risk (03/13/2024)   Epic    Unable to Pay for Housing in the Last Year: No    Number of Times Moved in the Last Year: 0    Homeless in the Last Year: No  Utilities: Not At Risk (01/25/2024)   Epic    Threatened with loss of utilities: No  Health Literacy: Adequate Health Literacy (01/25/2024)   B1300 Health Literacy    Frequency of need for help with medical instructions: Never   Allergies[1] No family history on file.  Current Outpatient Medications  (Endocrine & Metabolic):    levothyroxine (SYNTHROID, LEVOTHROID) 50 MCG tablet, Take by mouth.  Current Outpatient Medications (Cardiovascular):    valsartan (DIOVAN) 80 MG tablet, Take 80 mg by mouth.  Current Outpatient Medications (Hematological):    cyanocobalamin  (VITAMIN B12) 1000 MCG tablet, Take 1,000 mcg by mouth daily.  Current Outpatient Medications (Other):    cholecalciferol (VITAMIN D3) 25 MCG (1000 UNIT) tablet, Take 1,000 Units by mouth daily.   Diclofenac  Sodium (PENNSAID ) 2 % SOLN, Place 2 application onto the skin 2 (two) times daily.   DULoxetine  (CYMBALTA ) 60 MG capsule, TAKE 1 CAPSULE BY MOUTH EVERY DAY   magnesium 30 MG tablet, Take 30 mg by mouth daily.   Potassium Chloride CR (MICRO-K) 8 MEQ CPCR capsule CR, Take 24 mEq by mouth.   temazepam (RESTORIL) 15 MG capsule, Take 15 mg by mouth at bedtime as needed for sleep.   Reviewed prior external information including notes and imaging from  primary care provider As well as notes that were available from care everywhere and other healthcare systems.  Past medical history, social, surgical and family history all reviewed in electronic medical record.  No pertanent information unless stated regarding to the chief complaint.   Review of Systems:  No headache, visual changes, nausea, vomiting, diarrhea, constipation, dizziness, abdominal pain, skin rash, fevers, chills, night sweats, weight loss, swollen lymph nodes, body aches, joint swelling, chest pain, shortness of breath, mood changes. POSITIVE muscle aches  Objective  Blood pressure 124/72, pulse 93, height 5' 2 (1.575 m), SpO2 97%.   General: No apparent distress alert and oriented x3 mood and affect normal, dressed appropriately.  HEENT: Pupils equal, extraocular movements intact  Respiratory: Patient's speak in full sentences and does not appear short of breath  Antalgic gait noted.  Bilateral knees patient does have an effusion noted of the knees  bilaterally.  Minimal arthritic changes.  Severe tenderness over the medial joint space  Procedure: Real-time Ultrasound Guided Injection of right knee Device: GE Logiq Q7 Ultrasound guided injection is preferred based studies that show increased duration, increased effect, greater accuracy, decreased procedural pain, increased response rate, and decreased cost with ultrasound guided versus blind injection.  Verbal informed consent obtained.  Time-out conducted.  Noted no overlying erythema, induration, or other signs of local infection.  Skin prepped in a sterile fashion.  Local anesthesia: Topical Ethyl chloride.  With sterile technique and under real time ultrasound guidance: With a 22-gauge 2 inch needle patient was injected with 4 cc of 0.5% Marcaine and aspirated 25 cc of straw-colored fluid been injected 1 cc of Kenalog  40 mg/dL. This was from a superior lateral approach.  Completed without difficulty  Pain immediately resolved suggesting accurate placement of  the medication.  Advised to call if fevers/chills, erythema, induration, drainage, or persistent bleeding.  Images permanently stored  Impression: Technically successful ultrasound guided injection.  Procedure: Real-time Ultrasound Guided Injection of left knee Device: GE Logiq Q7 Ultrasound guided injection is preferred based studies that show increased duration, increased effect, greater accuracy, decreased procedural pain, increased response rate, and decreased cost with ultrasound guided versus blind injection.  Verbal informed consent obtained.  Time-out conducted.  Noted no overlying erythema, induration, or other signs of local infection.  Skin prepped in a sterile fashion.  Local anesthesia: Topical Ethyl chloride.  With sterile technique and under real time ultrasound guidance: With a 22-gauge 2 inch needle patient was injected with 4 cc of 0.5% Marcaine and aspirated 10 cc of straw-colored fluid then injected 1 cc of  Kenalog  40 mg/dL. This was from a superior lateral approach.  Completed without difficulty  Pain immediately resolved suggesting accurate placement of the medication.  Advised to call if fevers/chills, erythema, induration, drainage, or persistent bleeding.  Images permanently stored   Impression: Technically successful ultrasound guided injection.    Impression and Recommendations:    The above documentation has been reviewed and is accurate and complete Jenny CHRISTELLA Sharps, DO        [1]  Allergies Allergen Reactions   Tape Anaphylaxis and Rash    CLOTH TAPE IS OK (heart monitor electrodes/holter monitor  caused throat swelling) , Paper tape OK to use   Demerol Itching   Latex Rash   Meperidine Itching   Wound Dressing Adhesive    "

## 2024-06-13 ENCOUNTER — Ambulatory Visit: Admitting: Family Medicine

## 2024-06-13 ENCOUNTER — Ambulatory Visit

## 2024-06-13 ENCOUNTER — Other Ambulatory Visit: Payer: Self-pay

## 2024-06-13 ENCOUNTER — Encounter: Payer: Self-pay | Admitting: Family Medicine

## 2024-06-13 VITALS — BP 124/72 | HR 93 | Ht 62.0 in

## 2024-06-13 DIAGNOSIS — M170 Bilateral primary osteoarthritis of knee: Secondary | ICD-10-CM

## 2024-06-13 NOTE — Patient Instructions (Signed)
See you again in 4-6 weeks 

## 2024-06-13 NOTE — Assessment & Plan Note (Signed)
 Chronic problem but may have actually been a cracker related with a potential allergic reaction to follow-up again in 6 to 12 weeks patient did have the gel injection and could have been a potential allergy and we will monitor.  Would start Singulair if worsening symptoms.  X-rays noted today to rule out any type of occult fracture but I think it would be highly unlikely.

## 2024-06-14 ENCOUNTER — Ambulatory Visit: Payer: Self-pay | Admitting: Family Medicine

## 2024-06-14 LAB — SYNOVIAL FLUID ANALYSIS, COMPLETE
Basophils, %: 0 %
Eosinophils-Synovial: 0 % (ref 0–2)
Lymphocytes-Synovial Fld: 80 % — ABNORMAL HIGH (ref 0–74)
Monocyte/Macrophage: 5 % (ref 0–69)
Neutrophil, Synovial: 15 % (ref 0–24)
Synoviocytes, %: 0 % (ref 0–15)
WBC, Synovial: 98 {cells}/uL

## 2024-08-14 ENCOUNTER — Ambulatory Visit: Admitting: Family Medicine

## 2024-09-19 ENCOUNTER — Ambulatory Visit: Admitting: Internal Medicine

## 2025-01-25 ENCOUNTER — Ambulatory Visit
